# Patient Record
Sex: Female | Born: 1940 | Race: Black or African American | Hispanic: No | Smoking: Former smoker
Health system: Southern US, Community
[De-identification: ages and names within clinical notes are randomized; demographics above are authoritative.]

## PROBLEM LIST (undated history)

## (undated) DIAGNOSIS — F32A Depression, unspecified: Secondary | ICD-10-CM

## (undated) DIAGNOSIS — I10 Essential (primary) hypertension: Secondary | ICD-10-CM

## (undated) DIAGNOSIS — F329 Major depressive disorder, single episode, unspecified: Secondary | ICD-10-CM

## (undated) DIAGNOSIS — R4701 Aphasia: Secondary | ICD-10-CM

## (undated) DIAGNOSIS — R001 Bradycardia, unspecified: Secondary | ICD-10-CM

## (undated) DIAGNOSIS — I6200 Nontraumatic subdural hemorrhage, unspecified: Secondary | ICD-10-CM

## (undated) DIAGNOSIS — E119 Type 2 diabetes mellitus without complications: Secondary | ICD-10-CM

## (undated) DIAGNOSIS — F0390 Unspecified dementia without behavioral disturbance: Secondary | ICD-10-CM

---

## 1998-05-25 HISTORY — PX: PACEMAKER INSERTION: SHX728

## 2015-08-31 ENCOUNTER — Emergency Department (HOSPITAL_BASED_OUTPATIENT_CLINIC_OR_DEPARTMENT_OTHER)
Admission: EM | Admit: 2015-08-31 | Discharge: 2015-08-31 | Disposition: A | Discharge status: Home / Self Care | Payer: Medicare Other | Attending: Emergency Medicine | Admitting: Emergency Medicine

## 2015-08-31 ENCOUNTER — Emergency Department (HOSPITAL_BASED_OUTPATIENT_CLINIC_OR_DEPARTMENT_OTHER): Payer: Medicare Other

## 2015-08-31 ENCOUNTER — Encounter (HOSPITAL_BASED_OUTPATIENT_CLINIC_OR_DEPARTMENT_OTHER): Payer: Self-pay

## 2015-08-31 DIAGNOSIS — S0990XA Unspecified injury of head, initial encounter: Secondary | ICD-10-CM

## 2015-08-31 DIAGNOSIS — E119 Type 2 diabetes mellitus without complications: Secondary | ICD-10-CM | POA: Insufficient documentation

## 2015-08-31 DIAGNOSIS — Z79899 Other long term (current) drug therapy: Secondary | ICD-10-CM | POA: Insufficient documentation

## 2015-08-31 DIAGNOSIS — Y939 Activity, unspecified: Secondary | ICD-10-CM | POA: Insufficient documentation

## 2015-08-31 DIAGNOSIS — S80212A Abrasion, left knee, initial encounter: Secondary | ICD-10-CM | POA: Insufficient documentation

## 2015-08-31 DIAGNOSIS — W050XXA Fall from non-moving wheelchair, initial encounter: Secondary | ICD-10-CM | POA: Diagnosis not present

## 2015-08-31 DIAGNOSIS — I10 Essential (primary) hypertension: Secondary | ICD-10-CM | POA: Diagnosis not present

## 2015-08-31 DIAGNOSIS — Y929 Unspecified place or not applicable: Secondary | ICD-10-CM | POA: Insufficient documentation

## 2015-08-31 DIAGNOSIS — S0081XA Abrasion of other part of head, initial encounter: Secondary | ICD-10-CM | POA: Insufficient documentation

## 2015-08-31 DIAGNOSIS — Y999 Unspecified external cause status: Secondary | ICD-10-CM | POA: Insufficient documentation

## 2015-08-31 DIAGNOSIS — W19XXXA Unspecified fall, initial encounter: Secondary | ICD-10-CM

## 2015-08-31 HISTORY — DX: Bradycardia, unspecified: R00.1

## 2015-08-31 HISTORY — DX: Unspecified dementia, unspecified severity, without behavioral disturbance, psychotic disturbance, mood disturbance, and anxiety: F03.90

## 2015-08-31 HISTORY — DX: Essential (primary) hypertension: I10

## 2015-08-31 HISTORY — DX: Aphasia: R47.01

## 2015-08-31 HISTORY — DX: Type 2 diabetes mellitus without complications: E11.9

## 2015-08-31 LAB — CBG MONITORING, ED: GLUCOSE-CAPILLARY: 78 mg/dL (ref 65–99)

## 2015-08-31 NOTE — ED Notes (Signed)
[facility] called for transport back to [facility]

## 2015-08-31 NOTE — ED Notes (Signed)
Vitals during transport per EMS, 112/72, hr 72, CBG 145.  Pt alert, non-verbal at baseline.  Pt able to make sounds, but with incoherent mumbling per her normal baseline.  Pt suffered witnessed fall from wheelchair and mild superficial abrasion noted to left upper forehead.  Pt does not articulate any pain and appears relaxed and is smiling in bed, eyes open and attentive to people in the room.

## 2015-08-31 NOTE — Discharge Instructions (Signed)
Head Injury, Adult °You have a head injury. Headaches and throwing up (vomiting) are common after a head injury. It should be easy to wake up from sleeping. Sometimes you must stay in the hospital. Most problems happen within the first 24 hours. Side effects may occur up to 7-10 days after the injury.  °WHAT ARE THE TYPES OF HEAD INJURIES? °Head injuries can be as minor as a bump. Some head injuries can be more severe. More severe head injuries include: °· A jarring injury to the brain (concussion). °· A bruise of the brain (contusion). This mean there is bleeding in the brain that can cause swelling. °· A cracked skull (skull fracture). °· Bleeding in the brain that collects, clots, and forms a bump (hematoma). °WHEN SHOULD I GET HELP RIGHT AWAY?  °· You are confused or sleepy. °· You cannot be woken up. °· You feel sick to your stomach (nauseous) or keep throwing up (vomiting). °· Your dizziness or unsteadiness is getting worse. °· You have very bad, lasting headaches that are not helped by medicine. Take medicines only as told by your doctor. °· You cannot use your arms or legs like normal. °· You cannot walk. °· You notice changes in the black spots in the center of the colored part of your eye (pupil). °· You have clear or bloody fluid coming from your nose or ears. °· You have trouble seeing. °During the next 24 hours after the injury, you must stay with someone who can watch you. This person should get help right away (call 911 in the [place] if you start to shake and are not able to control it (have seizures), you pass out, or you are unable to wake up. °HOW CAN I PREVENT A HEAD INJURY IN THE FUTURE? °· Wear seat belts. °· Wear a helmet while bike riding and playing sports like football. °· Stay away from dangerous activities around the house. °WHEN CAN I RETURN TO NORMAL ACTIVITIES AND ATHLETICS? °See your doctor before doing these activities. You should not do normal activities or play contact sports until 1  week after the following symptoms have stopped: °· Headache that does not go away. °· Dizziness. °· Poor attention. °· Confusion. °· Memory problems. °· Sickness to your stomach or throwing up. °· Tiredness. °· Fussiness. °· Bothered by bright lights or loud noises. °· Anxiousness or depression. °· Restless sleep. °MAKE SURE YOU:  °· Understand these instructions. °· Will watch your condition. °· Will get help right away if you are not doing well or get worse. °  °This information is not intended to replace advice given to you by your health care provider. Make sure you discuss any questions you have with your health care provider. °  °Document Released: 04/23/2008 Document Revised: 06/01/2014 Document Reviewed: 01/16/2013 °Elsevier Interactive Patient Education ©2016 [employer] ° °

## 2015-08-31 NOTE — ED Notes (Signed)
Patient arrived from [facility] after falling forward from wheelchair this am onto carpeted floor. Redness noted to forehead. No other trauma noted. No complaints

## 2015-08-31 NOTE — ED Provider Notes (Signed)
CSN: [id number]     Arrival date & time 08/31/15  1117 History   First MD Initiated Contact with Patient 08/31/15 1123     Chief Complaint  Patient presents with  . Fall     (Consider location/radiation/quality/duration/timing/severity/associated sxs/prior Treatment) HPI Comments: Patient with a history of dementia, diabetes and hypertension presents after a fall. Per EMS, she was sitting in a wheelchair and fell forward out of the wheelchair onto a carpeted floor. There is no reported loss of consciousness. She's been at her baseline mental status per report. History is limited due to her dementia. She hasn't been complaining of any pain.  Patient is a 75 y.o. female presenting with fall.  Fall    Past Medical History  Diagnosis Date  . Dementia   . Aphasia   . Diabetes mellitus without complication (HCC)   . Hypertension   . Bradycardia    History reviewed. No pertinent past surgical history. No family history on file. Social History  Substance Use Topics  . Smoking status: Never Smoker   . Smokeless tobacco: None  . Alcohol Use: None   OB History    No data available     Review of Systems  Unable to perform ROS: Dementia      Allergies  Review of patient's allergies indicates no known allergies.  Home Medications   Prior to Admission medications   Medication Sig Start Date End Date Taking? Authorizing Provider  cholecalciferol (VITAMIN D) 1000 units tablet Take 1,000 Units by mouth daily.   Yes Historical Provider, MD  diltiazem (CARDIZEM CD) 180 MG 24 hr capsule Take 180 mg by mouth daily.   Yes Historical Provider, MD  escitalopram (LEXAPRO) 10 MG tablet Take 10 mg by mouth daily.   Yes Historical Provider, MD  fenofibrate micronized (LOFIBRA) 134 MG capsule Take 134 mg by mouth daily before breakfast.   Yes Historical Provider, MD  magnesium oxide (MAG-OX) 400 MG tablet Take 400 mg by mouth daily.   Yes Historical Provider, MD  senna (SENOKOT) 8.6 MG tablet  Take 1 tablet by mouth daily.   Yes Historical Provider, MD   BP 122/77 mmHg  Pulse 62  Temp(Src) 98.3 F (36.8 C) (Axillary)  Resp 18  SpO2 100% Physical Exam  Constitutional: She appears well-developed and well-nourished.  HENT:  Head: Normocephalic.  Small abrasion to the center of her forehead. There is no underlying bony tenderness. No swelling or tenderness to her facial bones.  Eyes: Pupils are equal, round, and reactive to light.  Neck: Normal range of motion. Neck supple.  No pain to the cervical thoracic or lumbosacral spine  Cardiovascular: Normal rate, regular rhythm and normal heart sounds.   Pulmonary/Chest: Effort normal and breath sounds normal. No respiratory distress. She has no wheezes. She has no rales. She exhibits no tenderness.  Abdominal: Soft. Bowel sounds are normal. There is no tenderness. There is no rebound and no guarding.  Musculoskeletal: Normal range of motion. She exhibits no edema.  There is a small abrasion on her left knee but no underlying tenderness on palpation. No other pain on palpation or range of motion of her extremities including the hips.  Lymphadenopathy:    She has no cervical adenopathy.  Neurological: She is alert.  Patient is alert but has nonsensical speech. She moves all actually symmetrically without focal deficits.  Skin: Skin is warm and dry. No rash noted.  Psychiatric: She has a normal mood and affect.    ED Course  Procedures (including critical care time) Labs Review Results for orders placed or performed during the hospital encounter of 08/31/15  CBG monitoring, ED  Result Value Ref Range   Glucose-Capillary 78 65 - 99 mg/dL   Ct Head Wo Contrast  08/31/2015  CLINICAL DATA:  alert, non-verbal at baseline. Pt able to make sounds, but with incoherent mumbling per her normal baseline. Pt suffered witnessed fall from wheelchair and mild superficial abrasion noted to left upper forehead. Pt does not articulate any pain and  appears relaxed and is smiling in bed, eyes open and attentive to people in the room. EXAM: CT HEAD WITHOUT CONTRAST CT CERVICAL SPINE WITHOUT CONTRAST TECHNIQUE: Multidetector CT imaging of the head and cervical spine was performed following the standard protocol without intravenous contrast. Multiplanar CT image reconstructions of the cervical spine were also generated. COMPARISON:  none FINDINGS: CT HEAD FINDINGS Brain: Moderate parenchymal atrophy. Patchy areas of hypoattenuation in deep and periventricular white matter bilaterally. Negative for acute intracranial hemorrhage, mass lesion, acute infarction, midline shift, or mass-effect. Acute infarct may be inapparent on noncontrast CT. Ventricles and sulci prominent, symmetric. Vascular: No hyperdense vessel or unexpected calcification. Atherosclerotic and physiologic intracranial calcifications. Skull: Negative for fracture or focal lesion. Sinuses/Orbits: Minimal layering debris in the sphenoid sinus. Patchy mucoperiosteal thickening in maxillary sinuses right worse than left. CT CERVICAL SPINE FINDINGS Mild reversal of the normal lordosis in the lower cervical spine. 2 mm anterolisthesis of C4 on C5 . Moderate narrowing of interspaces C5-6, C6-7, C7-T1 with anterior and posterior endplate spurring at these levels. Negative for fracture. Facets are seated bilaterally. 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 No prevertebral soft tissue swelling. Left calcified carotid bifurcation plaque. Visualized lung apices clear. IMPRESSION: 1.  Negative for bleed or other acute intracranial process. 2. Atrophy and nonspecific white matter changes. 3. Negative for cervical fracture or other acute bone abnormality. 4. 2 mm anterolisthesis C4-5 probably related to facet disease. Flexion/extension radiographs may be useful to exclude dynamic instability if this is a clinical concern. 5. Cervical spondylitic changes C3-T1 as above. Electronically Signed   By: Corlis Leak  M.D.   On: 08/31/2015 12:17   Ct Cervical Spine Wo Contrast  08/31/2015  CLINICAL DATA:  alert, non-verbal at baseline. Pt able to make sounds, but with incoherent mumbling per her normal baseline. Pt suffered witnessed fall from wheelchair and mild superficial abrasion noted to left upper forehead. Pt does not articulate any pain and appears relaxed and is smiling in bed, eyes open and attentive to people in the room. EXAM: CT HEAD WITHOUT CONTRAST CT CERVICAL SPINE WITHOUT CONTRAST TECHNIQUE: Multidetector CT imaging of the head and cervical spine was performed following the standard protocol without intravenous contrast. Multiplanar CT image reconstructions of the cervical spine were also generated. COMPARISON:  none FINDINGS: CT HEAD FINDINGS Brain: Moderate parenchymal atrophy. Patchy areas of hypoattenuation in deep and periventricular white matter bilaterally. Negative for acute intracranial hemorrhage, mass lesion, acute infarction, midline shift, or mass-effect. Acute infarct may be inapparent on noncontrast CT. Ventricles and sulci prominent, symmetric. Vascular: No hyperdense vessel or unexpected calcification. Atherosclerotic and physiologic intracranial calcifications. Skull: Negative for fracture or focal lesion. Sinuses/Orbits: Minimal layering debris in the sphenoid sinus. Patchy mucoperiosteal thickening in maxillary sinuses right worse than left. CT CERVICAL SPINE FINDINGS Mild reversal of the normal lordosis in the lower cervical spine. 2 mm anterolisthesis of C4 on C5 . Moderate narrowing of interspaces C5-6, C6-7, C7-T1 with anterior and posterior endplate spurring at  these levels. Negative for fracture. Facets are seated bilaterally. 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 No prevertebral soft tissue swelling. Left calcified carotid bifurcation plaque. Visualized lung apices clear. IMPRESSION: 1.  Negative for bleed or other acute intracranial process. 2. Atrophy and nonspecific white  matter changes. 3. Negative for cervical fracture or other acute bone abnormality. 4. 2 mm anterolisthesis C4-5 probably related to facet disease. Flexion/extension radiographs may be useful to exclude dynamic instability if this is a clinical concern. 5. Cervical spondylitic changes C3-T1 as above. Electronically Signed   By: Corlis Leak M.D.   On: 08/31/2015 12:17      Imaging Review Ct Head Wo Contrast  08/31/2015  CLINICAL DATA:  alert, non-verbal at baseline. Pt able to make sounds, but with incoherent mumbling per her normal baseline. Pt suffered witnessed fall from wheelchair and mild superficial abrasion noted to left upper forehead. Pt does not articulate any pain and appears relaxed and is smiling in bed, eyes open and attentive to people in the room. EXAM: CT HEAD WITHOUT CONTRAST CT CERVICAL SPINE WITHOUT CONTRAST TECHNIQUE: Multidetector CT imaging of the head and cervical spine was performed following the standard protocol without intravenous contrast. Multiplanar CT image reconstructions of the cervical spine were also generated. COMPARISON:  none FINDINGS: CT HEAD FINDINGS Brain: Moderate parenchymal atrophy. Patchy areas of hypoattenuation in deep and periventricular white matter bilaterally. Negative for acute intracranial hemorrhage, mass lesion, acute infarction, midline shift, or mass-effect. Acute infarct may be inapparent on noncontrast CT. Ventricles and sulci prominent, symmetric. Vascular: No hyperdense vessel or unexpected calcification. Atherosclerotic and physiologic intracranial calcifications. Skull: Negative for fracture or focal lesion. Sinuses/Orbits: Minimal layering debris in the sphenoid sinus. Patchy mucoperiosteal thickening in maxillary sinuses right worse than left. CT CERVICAL SPINE FINDINGS Mild reversal of the normal lordosis in the lower cervical spine. 2 mm anterolisthesis of C4 on C5 . Moderate narrowing of interspaces C5-6, C6-7, C7-T1 with anterior and posterior  endplate spurring at these levels. Negative for fracture. Facets are seated bilaterally. 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 No prevertebral soft tissue swelling. Left calcified carotid bifurcation plaque. Visualized lung apices clear. IMPRESSION: 1.  Negative for bleed or other acute intracranial process. 2. Atrophy and nonspecific white matter changes. 3. Negative for cervical fracture or other acute bone abnormality. 4. 2 mm anterolisthesis C4-5 probably related to facet disease. Flexion/extension radiographs may be useful to exclude dynamic instability if this is a clinical concern. 5. Cervical spondylitic changes C3-T1 as above. Electronically Signed   By: Corlis Leak M.D.   On: 08/31/2015 12:17   Ct Cervical Spine Wo Contrast  08/31/2015  CLINICAL DATA:  alert, non-verbal at baseline. Pt able to make sounds, but with incoherent mumbling per her normal baseline. Pt suffered witnessed fall from wheelchair and mild superficial abrasion noted to left upper forehead. Pt does not articulate any pain and appears relaxed and is smiling in bed, eyes open and attentive to people in the room. EXAM: CT HEAD WITHOUT CONTRAST CT CERVICAL SPINE WITHOUT CONTRAST TECHNIQUE: Multidetector CT imaging of the head and cervical spine was performed following the standard protocol without intravenous contrast. Multiplanar CT image reconstructions of the cervical spine were also generated. COMPARISON:  none FINDINGS: CT HEAD FINDINGS Brain: Moderate parenchymal atrophy. Patchy areas of hypoattenuation in deep and periventricular white matter bilaterally. Negative for acute intracranial hemorrhage, mass lesion, acute infarction, midline shift, or mass-effect. Acute infarct may be inapparent on noncontrast CT. Ventricles and sulci prominent, symmetric. Vascular: No  hyperdense vessel or unexpected calcification. Atherosclerotic and physiologic intracranial calcifications. Skull: Negative for fracture or focal lesion.  Sinuses/Orbits: Minimal layering debris in the sphenoid sinus. Patchy mucoperiosteal thickening in maxillary sinuses right worse than left. CT CERVICAL SPINE FINDINGS Mild reversal of the normal lordosis in the lower cervical spine. 2 mm anterolisthesis of C4 on C5 . Moderate narrowing of interspaces C5-6, C6-7, C7-T1 with anterior and posterior endplate spurring at these levels. Negative for fracture. Facets are seated bilaterally. 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 No prevertebral soft tissue swelling. Left calcified carotid bifurcation plaque. Visualized lung apices clear. IMPRESSION: 1.  Negative for bleed or other acute intracranial process. 2. Atrophy and nonspecific white matter changes. 3. Negative for cervical fracture or other acute bone abnormality. 4. 2 mm anterolisthesis C4-5 probably related to facet disease. Flexion/extension radiographs may be useful to exclude dynamic instability if this is a clinical concern. 5. Cervical spondylitic changes C3-T1 as above. Electronically Signed   By: Corlis Leak M.D.   On: 08/31/2015 12:17   I have personally reviewed and evaluated these images and lab results as part of my medical decision-making.   EKG Interpretation None      MDM   Final diagnoses:  Fall, initial encounter  Minor head injury, initial encounter    Patient presents after a fall. It sounds like a mechanical fall. She is alert and active. Her vital signs are unremarkable. She did have a CT scan of her head and neck given the head injury. There is no evidence of intracranial hemorrhage. There is no evidence of acute cervical spine injury. There is a small amount of anterolisthesis of C4 on C5. Patient doesn't seem to have any discomfort with movement of her neck. She was discharged back to the nursing facility. She's not on anticoagulants. She doesn't have any extremity discomfort on exam.    Rolan Bucco, MD 08/31/15 1232

## 2015-10-24 ENCOUNTER — Encounter (HOSPITAL_COMMUNITY): Payer: Self-pay | Admitting: *Deleted

## 2015-10-24 ENCOUNTER — Emergency Department (HOSPITAL_COMMUNITY): Payer: Medicare Other

## 2015-10-24 ENCOUNTER — Emergency Department (HOSPITAL_COMMUNITY)
Admission: EM | Admit: 2015-10-24 | Discharge: 2015-10-24 | Disposition: A | Discharge status: Skilled Nursing Facility | Payer: Medicare Other | Attending: Emergency Medicine | Admitting: Emergency Medicine

## 2015-10-24 DIAGNOSIS — F0280 Dementia in other diseases classified elsewhere without behavioral disturbance: Secondary | ICD-10-CM | POA: Insufficient documentation

## 2015-10-24 DIAGNOSIS — E119 Type 2 diabetes mellitus without complications: Secondary | ICD-10-CM | POA: Insufficient documentation

## 2015-10-24 DIAGNOSIS — Y929 Unspecified place or not applicable: Secondary | ICD-10-CM | POA: Insufficient documentation

## 2015-10-24 DIAGNOSIS — S0083XA Contusion of other part of head, initial encounter: Secondary | ICD-10-CM | POA: Diagnosis present

## 2015-10-24 DIAGNOSIS — I10 Essential (primary) hypertension: Secondary | ICD-10-CM | POA: Diagnosis not present

## 2015-10-24 DIAGNOSIS — G308 Other Alzheimer's disease: Secondary | ICD-10-CM | POA: Diagnosis not present

## 2015-10-24 DIAGNOSIS — W1830XA Fall on same level, unspecified, initial encounter: Secondary | ICD-10-CM | POA: Diagnosis not present

## 2015-10-24 DIAGNOSIS — Y939 Activity, unspecified: Secondary | ICD-10-CM | POA: Insufficient documentation

## 2015-10-24 DIAGNOSIS — Y999 Unspecified external cause status: Secondary | ICD-10-CM | POA: Diagnosis not present

## 2015-10-24 NOTE — ED Notes (Signed)
Pt's sats dropped to 65% while asleep, woke pt up, sats increased to 99%.

## 2015-10-24 NOTE — ED Provider Notes (Signed)
CSN: [id number]     Arrival date & time 10/24/15  4098 History   First MD Initiated Contact with Patient 10/24/15 0815     Chief Complaint  Patient presents with  . Fall     (Consider location/radiation/quality/duration/timing/severity/associated sxs/prior Treatment) HPI Comments: Patient with h/o alzheimer's dementia -- presents after unwitnessed fall. Hematoma noted on forehead. Presents in [place] with c-collar. No anticoagulation. Level V caveat 2/2 dementia.   Patient is a 75 y.o. female presenting with fall. The history is provided by the EMS personnel and medical records.  Fall    Past Medical History  Diagnosis Date  . Dementia   . Aphasia   . Diabetes mellitus without complication (HCC)   . Hypertension   . Bradycardia    No past surgical history on file. No family history on file. Social History  Substance Use Topics  . Smoking status: Never Smoker   . Smokeless tobacco: None  . Alcohol Use: None   OB History    No data available     Review of Systems  Unable to perform ROS: Dementia      Allergies  Review of patient's allergies indicates no known allergies.  Home Medications   Prior to Admission medications   Medication Sig Start Date End Date Taking? Authorizing Provider  cholecalciferol (VITAMIN D) 1000 units tablet Take 1,000 Units by mouth daily.    Historical Provider, MD  diltiazem (CARDIZEM CD) 180 MG 24 hr capsule Take 180 mg by mouth daily.    Historical Provider, MD  escitalopram (LEXAPRO) 10 MG tablet Take 10 mg by mouth daily.    Historical Provider, MD  fenofibrate micronized (LOFIBRA) 134 MG capsule Take 134 mg by mouth daily before breakfast.    Historical Provider, MD  magnesium oxide (MAG-OX) 400 MG tablet Take 400 mg by mouth daily.    Historical Provider, MD  senna (SENOKOT) 8.6 MG tablet Take 1 tablet by mouth daily.    Historical Provider, MD   BP 143/75 mmHg  Pulse 67  Temp(Src) 97.6 F (36.4 C) (Oral)  Resp 16  SpO2 98%    Physical Exam  Constitutional: She appears well-developed and well-nourished.  HENT:  Head: Normocephalic. Head is without raccoon's eyes and without Battle's sign.  Right Ear: Tympanic membrane, external ear and ear canal normal. No hemotympanum.  Left Ear: Tympanic membrane, external ear and ear canal normal. No hemotympanum.  Nose: Nose normal. No nasal septal hematoma.  Mouth/Throat: Uvula is midline, oropharynx is clear and moist and mucous membranes are normal.  Frontal forehead hematoma. Poor dentition.   Eyes: Conjunctivae, EOM and lids are normal. Pupils are equal, round, and reactive to light. Right eye exhibits no nystagmus. Left eye exhibits no nystagmus.  No visible hyphema noted  Neck: Normal range of motion. Neck supple.  Cardiovascular: Normal rate and regular rhythm.   No murmur heard. Pulmonary/Chest: Effort normal and breath sounds normal. No respiratory distress. She has no wheezes. She has no rales.  Abdominal: Soft. There is no tenderness.  Musculoskeletal:       Right hip: Normal.       Left hip: Normal.       Cervical back: She exhibits normal range of motion, no tenderness and no bony tenderness.       Thoracic back: She exhibits no tenderness and no bony tenderness.       Lumbar back: She exhibits no tenderness and no bony tenderness.  Patient does not have any apparent hip pain.  Can passively move extremities without apparent pain.   Neurological: She has normal strength. No cranial nerve deficit or sensory deficit. Coordination normal. GCS eye subscore is 4. GCS verbal subscore is 5. GCS motor subscore is 6.  Patient makes eye contact, mumbles unintelligably.   Skin: Skin is warm and dry.  Psychiatric: She has a normal mood and affect.  Nursing note and vitals reviewed.   ED Course  Procedures (including critical care time)  Imaging Review Ct Head Wo Contrast  10/24/2015  CLINICAL DATA:  Pain following fall.  History of dementia. EXAM: CT HEAD WITHOUT  CONTRAST CT CERVICAL SPINE WITHOUT CONTRAST TECHNIQUE: Multidetector CT imaging of the head and cervical spine was performed following the standard protocol without intravenous contrast. Multiplanar CT image reconstructions of the cervical spine were also generated. COMPARISON:  CT head and CT cervical spine August 31, 2015 FINDINGS: CT HEAD FINDINGS Moderate diffuse atrophy remain stable. There is no intracranial mass, hemorrhage, extra-axial fluid collection, or midline shift. Moderate small vessel disease in the centra semiovale is stable. There is no new gray-white compartment lesion. No acute infarct. There is a moderate midline frontal scalp hematoma. The bony calvarium appears intact. Mastoid air cells on the right are clear. There is chronic thickening of multiple mastoid air cells inferiorly on the left. Most of the mastoids on the left are clear and unchanged from prior study. No intraorbital lesions are evident. There is opacification in a portion of the left sphenoid sinus. There is also mucosal thickening in both maxillary antra. There is a small air-fluid level in the left frontal sinus laterally. CT CERVICAL SPINE FINDINGS There is no demonstrable fracture. There is 3 mm of anterolisthesis of C4 on C5, not appreciably changed from prior study. There is no new spondylolisthesis. Prevertebral soft tissues and predental space regions are within normal limits and stable. There is severe disc space narrowing at C5-6, C6-7, C7-T1, and T1-2. There is moderate disc space narrowing at C3-4 and C4-5. There is facet hypertrophy at multiple levels. There is exit foraminal narrowing at multiple levels, most notably at C3-4 on the right, C4-5 on the left, and C5-6 bilaterally. There are scattered foci of carotid artery calcification on the left. Thyroid shows mild inhomogeneity. IMPRESSION: CT head: Stable atrophy with periventricular small vessel disease. No intracranial mass, hemorrhage, or acute appearing infarct.  There is no extra-axial fluid collection. There is a midline frontal scalp hematoma without fracture evident. There is chronic inferior left mastoid disease, stable. There are areas of paranasal sinus disease. Note that there is a small air-fluid level in the lateral left frontal sinus. CT cervical spine: No fracture. Stable spondylolisthesis at C4-5, likely due to underlying spondylosis. No new spondylolisthesis. There is multilevel arthropathy, essentially stable. Left carotid artery calcification noted. Electronically Signed   By: Bretta BangWilliam  Woodruff III M.D.   On: 10/24/2015 09:33   Ct Cervical Spine Wo Contrast  10/24/2015  CLINICAL DATA:  Pain following fall.  History of dementia. EXAM: CT HEAD WITHOUT CONTRAST CT CERVICAL SPINE WITHOUT CONTRAST TECHNIQUE: Multidetector CT imaging of the head and cervical spine was performed following the standard protocol without intravenous contrast. Multiplanar CT image reconstructions of the cervical spine were also generated. COMPARISON:  CT head and CT cervical spine August 31, 2015 FINDINGS: CT HEAD FINDINGS Moderate diffuse atrophy remain stable. There is no intracranial mass, hemorrhage, extra-axial fluid collection, or midline shift. Moderate small vessel disease in the centra semiovale is stable. There is no new gray-white compartment  lesion. No acute infarct. There is a moderate midline frontal scalp hematoma. The bony calvarium appears intact. Mastoid air cells on the right are clear. There is chronic thickening of multiple mastoid air cells inferiorly on the left. Most of the mastoids on the left are clear and unchanged from prior study. No intraorbital lesions are evident. There is opacification in a portion of the left sphenoid sinus. There is also mucosal thickening in both maxillary antra. There is a small air-fluid level in the left frontal sinus laterally. CT CERVICAL SPINE FINDINGS There is no demonstrable fracture. There is 3 mm of anterolisthesis of C4 on  C5, not appreciably changed from prior study. There is no new spondylolisthesis. Prevertebral soft tissues and predental space regions are within normal limits and stable. There is severe disc space narrowing at C5-6, C6-7, C7-T1, and T1-2. There is moderate disc space narrowing at C3-4 and C4-5. There is facet hypertrophy at multiple levels. There is exit foraminal narrowing at multiple levels, most notably at C3-4 on the right, C4-5 on the left, and C5-6 bilaterally. There are scattered foci of carotid artery calcification on the left. Thyroid shows mild inhomogeneity. IMPRESSION: CT head: Stable atrophy with periventricular small vessel disease. No intracranial mass, hemorrhage, or acute appearing infarct. There is no extra-axial fluid collection. There is a midline frontal scalp hematoma without fracture evident. There is chronic inferior left mastoid disease, stable. There are areas of paranasal sinus disease. Note that there is a small air-fluid level in the lateral left frontal sinus. CT cervical spine: No fracture. Stable spondylolisthesis at C4-5, likely due to underlying spondylosis. No new spondylolisthesis. There is multilevel arthropathy, essentially stable. Left carotid artery calcification noted. Electronically Signed   By: Bretta Bang III M.D.   On: 10/24/2015 09:33   I have personally reviewed and evaluated these images and lab results as part of my medical decision-making.   8:24 AM Patient seen and examined. Dementia and non-verbal. Only apparent injury seems to be to head. Imaging ordered.   Vital signs reviewed and are as follows: BP 143/75 mmHg  Pulse 67  Temp(Src) 97.6 F (36.4 C) (Oral)  Resp 16  SpO2 98%  10:02 AM Imaging is neg. Will d/c. Patient re-evaluated. Still no apparent pain with ranging of extremities. Chest wall and abdomen are non-tender. Will d/c.   MDM   Final diagnoses:  Traumatic hematoma of forehead, initial encounter   Imaging neg. Patient back  to nursing facility.    Renne Crigler, PA-C 10/24/15 1004  Melene Plan, DO 10/24/15 1009

## 2015-10-24 NOTE — ED Notes (Signed)
Per EMS[facility] pt sitting in her WC, staff found pt on floor beside her WC.  Hematoma noted on forehead.  Not on blood thinners.  Pt has hx of alzheimer's Disease.

## 2015-10-24 NOTE — ED Notes (Signed)
See triage note.  Large hematoma noted on pt's forehead.  Pt is non-verbal.  Pt is guarding her L arm, however, does not react when L arm is manipulated by Adela LankFloyd EDP.  Pt speaks spanish.

## 2015-10-24 NOTE — ED Notes (Signed)
[facility]

## 2015-10-24 NOTE — Discharge Instructions (Signed)
Please read and follow all provided instructions.  Your diagnoses today include:  1. Traumatic hematoma of forehead, initial encounter     Tests performed today include:  CT scan of your head and neck that did not show any serious injury.  Vital signs. See below for your results today.   Medications prescribed:   None  Take any prescribed medications only as directed.  Home care instructions:  Follow any educational materials contained in this packet.  BE VERY CAREFUL not to take multiple medicines containing Tylenol (also called acetaminophen). Doing so can lead to an overdose which can damage your liver and cause liver failure and possibly death.   Follow-up instructions: Please follow-up with your primary care provider in the next 3 days for further evaluation of your symptoms.   Return instructions:  SEEK IMMEDIATE MEDICAL ATTENTION IF:  There is confusion or drowsiness (although children frequently become drowsy after injury).   You cannot awaken the injured person.   You have more than one episode of vomiting.   You notice dizziness or unsteadiness which is getting worse, or inability to walk.   You have convulsions or unconsciousness.   You experience severe, persistent headaches not relieved by Tylenol.  You cannot use arms or legs normally.   There are changes in pupil sizes. (This is the black center in the colored part of the eye)   There is clear or bloody discharge from the nose or ears.   You have change in speech, vision, swallowing, or understanding.   Localized weakness, numbness, tingling, or change in bowel or bladder control.  You have any other emergent concerns.  Additional Information: You have had a head injury which does not appear to require admission at this time.  Your vital signs today were: BP 143/75 mmHg   Pulse 67   Temp(Src) 97.6 F (36.4 C) (Oral)   Resp 16   SpO2 98% If your blood pressure (BP) was elevated above 135/85  this visit, please have this repeated by your doctor within one month. --------------

## 2015-10-24 NOTE — ED Notes (Signed)
[facility] contacted for transport back to [facility] °

## 2015-10-24 NOTE — ED Notes (Signed)
Bed: [id number] Expected date:  Expected time:  Means of arrival:  Comments: EMS- 90s, unwitnessed fall/no injury

## 2015-11-18 ENCOUNTER — Telehealth: Payer: Self-pay | Admitting: Cardiovascular Disease

## 2015-11-18 NOTE — Telephone Encounter (Signed)
11/18/2015 [facility] for upcoming appointment with Dr. Royann Shiversroitoru on 01/03/2016.  [facility]  cbr

## 2016-01-03 ENCOUNTER — Ambulatory Visit (INDEPENDENT_AMBULATORY_CARE_PROVIDER_SITE_OTHER): Payer: Medicare Other | Admitting: Cardiovascular Disease

## 2016-01-03 ENCOUNTER — Encounter: Payer: Self-pay | Admitting: Cardiovascular Disease

## 2016-01-03 ENCOUNTER — Encounter (INDEPENDENT_AMBULATORY_CARE_PROVIDER_SITE_OTHER): Payer: Self-pay

## 2016-01-03 VITALS — BP 114/80 | HR 71

## 2016-01-03 DIAGNOSIS — E785 Hyperlipidemia, unspecified: Secondary | ICD-10-CM

## 2016-01-03 DIAGNOSIS — Z950 Presence of cardiac pacemaker: Secondary | ICD-10-CM | POA: Diagnosis not present

## 2016-01-03 DIAGNOSIS — I471 Supraventricular tachycardia: Secondary | ICD-10-CM | POA: Diagnosis not present

## 2016-01-03 DIAGNOSIS — R011 Cardiac murmur, unspecified: Secondary | ICD-10-CM

## 2016-01-03 DIAGNOSIS — I495 Sick sinus syndrome: Secondary | ICD-10-CM

## 2016-01-03 NOTE — Progress Notes (Signed)
Cardiology Office Note    Date:  01/03/2016   ID:  Paige Flores, DOB 1940-10-15, MRN [id number]  PCP:  Paige Parker, MD  Cardiologist:   Paige Fair, MD    chief complaint: Pacemaker evaluation   History of Present Illness:  Paige Flores is a 75 y.o. female, originally [place] with a dual-chamber permanent pacemaker was reportedly implanted in [place] in 2011, as a generator change out with initial implantation of her device in 2001. She was briefly followed by Dr. Hanley Flores, but it seems her pacemaker has not been interrogated since 2012.   The patient has significant dementia and lives at [facility]. Her daughter Paige Flores accompanies her to the appointment today. The patient has a DO NOT RESUSCITATE status.  She is pleasantly demented and smiles. She speaks Spanish only but even in Spanish does not make any sense. She has a heart murmur but we do not know any details about structural heart disease. She takes fenofibrate presumably for hypertriglyceridemia but is not on a statin. Current medications to also include diltiazem, suggesting she has possibly had problems with atrial arrhythmia. Very little detail is available about previous medical problems.  Interrogation of her pacemaker shows normal device function. She has a Environmental manager true device with an estimated 5 years of remaining battery longevity. The presenting rhythm is atrial sensed (sinus rhythm) ventricular sensed alternating with atrial paced ventricular sensed. Counters show she virtually never has ventricular pacing. She has roughly 14% atrial pacing. In the 5 years that have elapsed since her last device check she has had 29 episodes of mode switch, all of which appear to be brief nonsustained atrial tachycardia. Heart rate histogram distribution is normal. She is clearly not pacemaker dependent. All the lead parameters are normal:  Atrial lead P waves 1.8 mV, impedance 530 ohms, threshold 0.6 V at 0.4 ms  (checked manually today). Ventricular lead R waves 8.7 mV, impedance 840 ohms, threshold 0.8 V at 0.4 ms (auto capture yesterday, confirmed manually today).    Past Medical History:  Diagnosis Date  . Aphasia   . Bradycardia   . Dementia   . Diabetes mellitus without complication (HCC)   . Hypertension     No past surgical history on file.  Current Medications: Outpatient Medications Prior to Visit  Medication Sig Dispense Refill  . acetaminophen (TYLENOL) 500 MG tablet Take 500 mg by mouth every 4 (four) hours as needed for mild pain or headache.    Marland Kitchen alum & mag hydroxide-simeth (MINTOX) 200-200-20 MG/5ML suspension Take 30 mLs by mouth as needed for indigestion or heartburn. Max 4 doses per 24 hours    . cholecalciferol (VITAMIN D) 1000 units tablet Take 1,000 Units by mouth daily.    Marland Kitchen diltiazem (CARDIZEM CD) 180 MG 24 hr capsule Take 180 mg by mouth daily.    Marland Kitchen escitalopram (LEXAPRO) 10 MG tablet Take 10 mg by mouth daily.    . fenofibrate micronized (LOFIBRA) 134 MG capsule Take 134 mg by mouth daily before breakfast.    . guaifenesin (ROBITUSSIN) 100 MG/5ML syrup Take 200 mg by mouth every 6 (six) hours as needed for cough.    . hydrOXYzine (ATARAX/VISTARIL) 10 MG tablet Take 10 mg by mouth every 8 (eight) hours as needed for itching.    Marland Kitchen L-Methylfolate-B12-B6-B2 (CEREFOLIN PO) Take 1 tablet by mouth daily.    Marland Kitchen loperamide (IMODIUM) 2 MG capsule Take 2 mg by mouth as needed for diarrhea or loose stools. Max 8 doses  in 24 hrs    . magnesium hydroxide (MILK OF MAGNESIA) 400 MG/5ML suspension Take 30 mLs by mouth at bedtime as needed for mild constipation.    . magnesium oxide (MAG-OX) 400 MG tablet Take 400 mg by mouth daily.    Marland Kitchen neomycin-bacitracin-polymyxin (NEOSPORIN) ointment Apply 1 application topically as needed for wound care. apply to eye    . senna (SENOKOT) 8.6 MG tablet Take 1 tablet by mouth daily.     No facility-administered medications prior to visit.       Allergies:   Review of patient's allergies indicates no known allergies.   Social History   Social History  . Marital status: Widowed    Spouse name: N/A  . Number of children: N/A  . Years of education: N/A   Social History Main Topics  . Smoking status: Never Smoker  . Smokeless tobacco: Never Used  . Alcohol use No  . Drug use: No  . Sexual activity: Not Asked   Other Topics Concern  . None   Social History Narrative  . None     Family History:  The patient's family history includes Dementia in her mother; Healthy in her father.   ROS:   Please see the history of present illness.    ROS All other systems reviewed and are negative.   PHYSICAL EXAM:   VS:  BP 114/80   Pulse 71   unable to stand for weight or height, in wheelchair GEN: Well nourished, well developed, in no acute distress  HEENT: normal  Neck: no JVD, Faint carotid bruits bilaterally radiating from the chest, no goiter or masses Cardiac: RRR;2/6 early peaking aortic ejection murmur no Diastolic murmurs, rubs, or gallops,no edema , left subclavian pacemaker site looks healthy Respiratory:  clear to auscultation bilaterally, normal work of breathing GI: soft, nontender, nondistended, + BS MS: no deformity or atrophy  Skin: warm and dry, no rash Neuro:  Alert and Oriented x 3, Strength and sensation are intact Psych: euthymic mood, full affect  Wt Readings from Last 3 Encounters:  No data found for Wt      Studies/Labs Reviewed:   EKG:  EKG is ordered today.  The ekg ordered today demonstrates Sinus rhythm alternating with atrial paced-ventricular sensed rhythm, left anterior fascicular block, mild ST segment depression into inversion in leads V3-V6 and QS pattern in leads V1-V2  Recent Labs: No results found for requested labs within last 8760 hours.   Lipid Panel No results found for: CHOL, TRIG, HDL, CHOLHDL, VLDL, LDLCALC, LDLDIRECT  Additional studies/ records that were reviewed today  include:  Records from recent ER visits and evaluation for head injury    ASSESSMENT:    1. Sinus node dysfunction (HCC)   2. Pacemaker   3. Paroxysmal atrial tachycardia (HCC)   4. Murmur   5. Hyperlipidemia      PLAN:  In order of problems listed above:  1. SSS: Presumably her pacemaker was implanted for sinus node dysfunction, but she is clearly not pacemaker dependent and actually requires pacing only very infrequently. Rate histogram distribution appears appropriate. No changes are made to pacemaker settings today 2. PPM: Normal device function. It is difficult for her to come to the office for device checks. Clearly the patient wishes for the focus to be on her mother's quality of life and she understands the limitations imposed by her dementia. We have agreed that at least for the time being it is appropriate to perform pacemaker checks only  once a year. She has not had any problems in last 5 years on her pacemaker was not checked at all. 3. PAT: The episodes of atrial tachycardia are extremely infrequent. Only one episode has happened in the last year. Specific therapy is not necessary. The episodes are brief and as far as I can tell are not symptomatic 4. 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 She is not in any way candidate for surgery and further evaluation is deferred 5. HLP: It is doubtful that treatment with fenofibrate will make any difference in quality of life for length of life, whether or not she has hypertriglyceridemia. I think this medication can be discontinued. She does not have a history of pancreatitis.    Medication Adjustments/Labs and Tests Ordered: Current medicines are reviewed at length with the patient today.  Concerns regarding medicines are outlined above.  Medication changes, Labs and Tests ordered today are listed in the Patient Instructions below. Patient Instructions  Dr Royann Shiversroitoru recommends that you  schedule a follow-up appointment in 1 year. You will receive a reminder letter in the mail two months in advance. If you don't receive a letter, please call our office to schedule the follow-up appointment.    Signed, Paige FairMihai Waneda Klammer, MD  01/03/2016 5:28 PM    [facility] [address], [place], [place]  [address] Phone: [phone]; Fax: [phone]

## 2016-01-03 NOTE — Patient Instructions (Signed)
Dr Croitoru recommends that you schedule a follow-up appointment in 1 year. You will receive a reminder letter in the mail two months in advance. If you don't receive a letter, please call our office to schedule the follow-up appointment. 

## 2016-01-21 LAB — CUP PACEART INCLINIC DEVICE CHECK
Battery Remaining Longevity: 60 mo
Brady Statistic AP VP Percent: 0 %
Brady Statistic AP VS Percent: 14 %
Date Time Interrogation Session: 20170829084513
Lead Channel Impedance Value: 840 Ohm
Lead Channel Pacing Threshold Amplitude: 0.6 V
Lead Channel Pacing Threshold Amplitude: 0.8 V
Lead Channel Pacing Threshold Pulse Width: 0.4 ms
Lead Channel Pacing Threshold Pulse Width: 0.4 ms
Lead Channel Sensing Intrinsic Amplitude: 1.8 mV
Lead Channel Sensing Intrinsic Amplitude: 8.7 mV
Lead Channel Setting Pacing Amplitude: 2 V
Lead Channel Setting Pacing Amplitude: 2.5 V
Lead Channel Setting Pacing Pulse Width: 0.4 ms
MDC IDC MSMT LEADCHNL RA IMPEDANCE VALUE: 530 Ohm
MDC IDC STAT BRADY AS VP PERCENT: 0 %
MDC IDC STAT BRADY AS VS PERCENT: 86 %
Pulse Gen Serial Number: 152758

## 2016-01-22 ENCOUNTER — Emergency Department (HOSPITAL_COMMUNITY): Payer: Medicare Other

## 2016-01-22 ENCOUNTER — Emergency Department (HOSPITAL_COMMUNITY)
Admission: EM | Admit: 2016-01-22 | Discharge: 2016-01-22 | Disposition: A | Discharge status: Home / Self Care | Payer: Medicare Other | Attending: Emergency Medicine | Admitting: Emergency Medicine

## 2016-01-22 ENCOUNTER — Encounter (HOSPITAL_COMMUNITY): Payer: Self-pay | Admitting: Emergency Medicine

## 2016-01-22 DIAGNOSIS — W050XXA Fall from non-moving wheelchair, initial encounter: Secondary | ICD-10-CM | POA: Insufficient documentation

## 2016-01-22 DIAGNOSIS — Y939 Activity, unspecified: Secondary | ICD-10-CM | POA: Insufficient documentation

## 2016-01-22 DIAGNOSIS — E119 Type 2 diabetes mellitus without complications: Secondary | ICD-10-CM | POA: Insufficient documentation

## 2016-01-22 DIAGNOSIS — S0003XA Contusion of scalp, initial encounter: Secondary | ICD-10-CM | POA: Insufficient documentation

## 2016-01-22 DIAGNOSIS — Z79899 Other long term (current) drug therapy: Secondary | ICD-10-CM | POA: Insufficient documentation

## 2016-01-22 DIAGNOSIS — S0990XA Unspecified injury of head, initial encounter: Secondary | ICD-10-CM | POA: Diagnosis present

## 2016-01-22 DIAGNOSIS — F0390 Unspecified dementia without behavioral disturbance: Secondary | ICD-10-CM | POA: Diagnosis not present

## 2016-01-22 DIAGNOSIS — Y999 Unspecified external cause status: Secondary | ICD-10-CM | POA: Insufficient documentation

## 2016-01-22 DIAGNOSIS — W19XXXA Unspecified fall, initial encounter: Secondary | ICD-10-CM

## 2016-01-22 DIAGNOSIS — I10 Essential (primary) hypertension: Secondary | ICD-10-CM | POA: Insufficient documentation

## 2016-01-22 DIAGNOSIS — Z950 Presence of cardiac pacemaker: Secondary | ICD-10-CM | POA: Diagnosis not present

## 2016-01-22 DIAGNOSIS — Y9289 Other specified places as the place of occurrence of the external cause: Secondary | ICD-10-CM | POA: Diagnosis not present

## 2016-01-22 NOTE — ED Notes (Signed)
[facility].  Primary RN attempted to call report w/o answer.  PTAR verbalized understanding discharge instructions. In no acute distress.

## 2016-01-22 NOTE — ED Notes (Signed)
MD at bedside. 

## 2016-01-22 NOTE — ED Notes (Signed)
Patient transported to CT 

## 2016-01-22 NOTE — ED Triage Notes (Signed)
Patient from [facility] for fall from wheelchair. paitent didn't have any LOC per staff at SNF and is per patient's norm baseline which is non-verbal. Patient not on blood thinners.  Patient only has hematoma to head that EMS noted.

## 2016-01-22 NOTE — ED Provider Notes (Signed)
[facility]-EMERGENCY DEPT Provider Note   CSN: [id number] date & time: 01/22/16  1013     History   Chief Complaint Chief Complaint  Patient presents with  . Fall  . hematoma on head    HPI Paige Flores is a 75 y.o. female.  The history is provided by the nursing home. History limited by: Dementia.  Patient is brought to the emergency department after a fall from a wheelchair today.  No reported loss consciousness per staff.  Patient is not on blood thinners at his baseline mental status.  Patient presents with a hematoma to the forehead.  The patient has dementia and therefore cannot provide any additional information.  Past Medical History:  Diagnosis Date  . Aphasia   . Bradycardia   . Dementia   . Diabetes mellitus without complication (HCC)   . Hypertension     Patient Active Problem List   Diagnosis Date Noted  . Paroxysmal atrial tachycardia (HCC) 01/03/2016  . SSS (sick sinus syndrome) (HCC) 01/03/2016  . Pacemaker 01/03/2016  . Murmur 01/03/2016  . Hyperlipidemia 01/03/2016    History reviewed. No pertinent surgical history.  OB History    No data available       Home Medications    Prior to Admission medications   Medication Sig Start Date End Date Taking? Authorizing Provider  acetaminophen (TYLENOL) 500 MG tablet Take 500 mg by mouth every 4 (four) hours as needed for mild pain or headache.    Historical Provider, MD  alum & mag hydroxide-simeth (MINTOX) 200-200-20 MG/5ML suspension Take 30 mLs by mouth as needed for indigestion or heartburn. Max 4 doses per 24 hours    Historical Provider, MD  cholecalciferol (VITAMIN D) 1000 units tablet Take 1,000 Units by mouth daily.    Historical Provider, MD  diltiazem (CARDIZEM CD) 180 MG 24 hr capsule Take 180 mg by mouth daily.    Historical Provider, MD  escitalopram (LEXAPRO) 10 MG tablet Take 10 mg by mouth daily.    Historical Provider, MD  fenofibrate micronized (LOFIBRA) 134 MG capsule Take  134 mg by mouth daily before breakfast.    Historical Provider, MD  guaifenesin (ROBITUSSIN) 100 MG/5ML syrup Take 200 mg by mouth every 6 (six) hours as needed for cough.    Historical Provider, MD  hydrOXYzine (ATARAX/VISTARIL) 10 MG tablet Take 10 mg by mouth every 8 (eight) hours as needed for itching.    Historical Provider, MD  L-Methylfolate-B12-B6-B2 (CEREFOLIN PO) Take 1 tablet by mouth daily.    Historical Provider, MD  loperamide (IMODIUM) 2 MG capsule Take 2 mg by mouth as needed for diarrhea or loose stools. Max 8 doses in 24 hrs    Historical Provider, MD  magnesium hydroxide (MILK OF MAGNESIA) 400 MG/5ML suspension Take 30 mLs by mouth at bedtime as needed for mild constipation.    Historical Provider, MD  magnesium oxide (MAG-OX) 400 MG tablet Take 400 mg by mouth daily.    Historical Provider, MD  neomycin-bacitracin-polymyxin (NEOSPORIN) ointment Apply 1 application topically as needed for wound care. apply to eye    Historical Provider, MD  senna (SENOKOT) 8.6 MG tablet Take 1 tablet by mouth daily.    Historical Provider, MD    Family History Family History  Problem Relation Age of Onset  . Dementia Mother   . Healthy Father     Social History Social History  Substance Use Topics  . Smoking status: Never Smoker  . Smokeless tobacco: Never Used  .  Alcohol use No     Allergies   Review of patient's allergies indicates no known allergies.   Review of Systems Review of Systems  Unable to perform ROS: Dementia     Physical Exam Updated Vital Signs BP 133/66 (BP Location: Left Arm)   Pulse 60   Temp 97.8 F (36.6 C) (Oral)   Resp 18   SpO2 100%   Physical Exam  Constitutional: She appears well-developed and well-nourished. No distress.  HENT:  Small frontal forehead scalp hematoma without abrasion or laceration  Eyes: EOM are normal.  Neck: Normal range of motion. Neck supple.  Mild cervical and paracervical tenderness without cervical step-off.    Cardiovascular: Normal rate, regular rhythm and normal heart sounds.   Pulmonary/Chest: Effort normal and breath sounds normal.  Abdominal: Soft. She exhibits no distension. There is no tenderness.  Musculoskeletal: Normal range of motion.  Full range of motion bilateral hips, knees, ankles.  Full range of motion bilateral shoulders, elbows, wrists  Neurological: She is alert.  Skin: Skin is warm and dry.  Psychiatric: She has a normal mood and affect. Judgment normal.  Nursing note and vitals reviewed.    ED Treatments / Results  Labs (all labs ordered are listed, but only abnormal results are displayed) Labs Reviewed - No data to display  EKG  EKG Interpretation None       Radiology Ct Head Wo Contrast  Result Date: 01/22/2016 CLINICAL DATA:  Status post fall out of a wheelchair today. EXAM: CT HEAD WITHOUT CONTRAST CT CERVICAL SPINE WITHOUT CONTRAST TECHNIQUE: Multidetector CT imaging of the head and cervical spine was performed following the standard protocol without intravenous contrast. Multiplanar CT image reconstructions of the cervical spine were also generated. COMPARISON:  Head and cervical spine CT scans 10/24/2015. FINDINGS: CT HEAD FINDINGS Soft tissue contusion over the frontal bone seen on the most recent study has improved. Marked cortical atrophy and extensive chronic microvascular ischemic change are seen. No evidence of acute abnormality including hemorrhage, infarct, mass lesion, mass effect, midline shift or abnormal extra-axial fluid collection. No hydrocephalus or pneumocephalus. The calvarium is intact. Left mastoid effusion is unchanged. Atherosclerotic vascular disease is noted. CT CERVICAL SPINE FINDINGS 0.3 cm anterolisthesis C4 on C5 due to facet arthropathy is unchanged. Alignment is otherwise unremarkable. No fracture. Severe loss of disc space height is seen from C5 into the upper thoracic spine, unchanged. Lung apices are clear. IMPRESSION: No acute  abnormality head or cervical spine. Frontal hematoma is improved since the prior exam. Marked atrophy and extensive chronic microvascular ischemic change, unchanged. No change in a left mastoid effusion. No change in advanced multilevel cervical spondylosis. Electronically Signed   By: Drusilla Kanner M.D.   On: 01/22/2016 11:55   Ct Cervical Spine Wo Contrast  Result Date: 01/22/2016 CLINICAL DATA:  Status post fall out of a wheelchair today. EXAM: CT HEAD WITHOUT CONTRAST CT CERVICAL SPINE WITHOUT CONTRAST TECHNIQUE: Multidetector CT imaging of the head and cervical spine was performed following the standard protocol without intravenous contrast. Multiplanar CT image reconstructions of the cervical spine were also generated. COMPARISON:  Head and cervical spine CT scans 10/24/2015. FINDINGS: CT HEAD FINDINGS Soft tissue contusion over the frontal bone seen on the most recent study has improved. Marked cortical atrophy and extensive chronic microvascular ischemic change are seen. No evidence of acute abnormality including hemorrhage, infarct, mass lesion, mass effect, midline shift or abnormal extra-axial fluid collection. No hydrocephalus or pneumocephalus. The calvarium is intact. Left mastoid  effusion is unchanged. Atherosclerotic vascular disease is noted. CT CERVICAL SPINE FINDINGS 0.3 cm anterolisthesis C4 on C5 due to facet arthropathy is unchanged. Alignment is otherwise unremarkable. No fracture. Severe loss of disc space height is seen from C5 into the upper thoracic spine, unchanged. Lung apices are clear. IMPRESSION: No acute abnormality head or cervical spine. Frontal hematoma is improved since the prior exam. Marked atrophy and extensive chronic microvascular ischemic change, unchanged. No change in a left mastoid effusion. No change in advanced multilevel cervical spondylosis. Electronically Signed   By: Drusilla Kanner M.D.   On: 01/22/2016 11:55    Procedures Procedures (including  critical care time)  Medications Ordered in ED Medications - No data to display   Initial Impression / Assessment and Plan / ED Course  I have reviewed the triage vital signs and the nursing notes.  Pertinent labs & imaging results that were available during my care of the patient were reviewed by me and considered in my medical decision making (see chart for details).  Clinical Course    Acute imaging without pathology.  Discharge home in good condition.  Full range of motion bilateral hips and knees.  Final Clinical Impressions(s) / ED Diagnoses   Final diagnoses:  Fall, initial encounter  Scalp hematoma, initial encounter    New Prescriptions New Prescriptions   No medications on file     Azalia Bilis, MD 01/22/16 1209

## 2016-01-22 NOTE — ED Notes (Signed)
Bed: [id number] Expected date:  Expected time:  Means of arrival:  Comments: EMS- 75yo F, fall/head injury

## 2016-01-22 NOTE — ED Notes (Signed)
[facility]

## 2016-01-22 NOTE — ED Notes (Signed)
[facility] notified of transportation need 

## 2016-01-28 ENCOUNTER — Encounter: Payer: Self-pay | Admitting: Cardiovascular Disease

## 2016-02-01 ENCOUNTER — Encounter (HOSPITAL_COMMUNITY): Payer: Self-pay | Admitting: Emergency Medicine

## 2016-02-01 ENCOUNTER — Emergency Department (HOSPITAL_COMMUNITY)
Admission: EM | Admit: 2016-02-01 | Discharge: 2016-02-01 | Disposition: A | Discharge status: Home / Self Care | Payer: Medicare Other | Attending: Emergency Medicine | Admitting: Emergency Medicine

## 2016-02-01 DIAGNOSIS — Z791 Long term (current) use of non-steroidal anti-inflammatories (NSAID): Secondary | ICD-10-CM | POA: Diagnosis not present

## 2016-02-01 DIAGNOSIS — I10 Essential (primary) hypertension: Secondary | ICD-10-CM | POA: Insufficient documentation

## 2016-02-01 DIAGNOSIS — Z950 Presence of cardiac pacemaker: Secondary | ICD-10-CM | POA: Insufficient documentation

## 2016-02-01 DIAGNOSIS — Y929 Unspecified place or not applicable: Secondary | ICD-10-CM | POA: Diagnosis not present

## 2016-02-01 DIAGNOSIS — E119 Type 2 diabetes mellitus without complications: Secondary | ICD-10-CM | POA: Diagnosis not present

## 2016-02-01 DIAGNOSIS — F0390 Unspecified dementia without behavioral disturbance: Secondary | ICD-10-CM | POA: Diagnosis not present

## 2016-02-01 DIAGNOSIS — Y939 Activity, unspecified: Secondary | ICD-10-CM | POA: Insufficient documentation

## 2016-02-01 DIAGNOSIS — R251 Tremor, unspecified: Secondary | ICD-10-CM | POA: Diagnosis present

## 2016-02-01 DIAGNOSIS — W19XXXA Unspecified fall, initial encounter: Secondary | ICD-10-CM

## 2016-02-01 DIAGNOSIS — W050XXA Fall from non-moving wheelchair, initial encounter: Secondary | ICD-10-CM | POA: Diagnosis not present

## 2016-02-01 DIAGNOSIS — Y999 Unspecified external cause status: Secondary | ICD-10-CM | POA: Diagnosis not present

## 2016-02-01 HISTORY — DX: Major depressive disorder, single episode, unspecified: F32.9

## 2016-02-01 HISTORY — DX: Depression, unspecified: F32.A

## 2016-02-01 NOTE — ED Notes (Signed)
MD at bedside. 

## 2016-02-01 NOTE — Discharge Instructions (Signed)
You suffered a fall from a wheelchair. There are no signs of any traumatic injuries. If you notice any changes to your mental status, please return to the emergency department for further evaluation.

## 2016-02-01 NOTE — ED Provider Notes (Signed)
[facility]-EMERGENCY DEPT Provider Note   CSN: [id number] Arrival date & time: 02/01/16  0735     History   Chief Complaint Chief Complaint  Patient presents with  . Fall    HPI Paige Flores is a 75 y.o. female.  Patient Hospital history of dementia, hypertension, diabetes or lives at a nursing home and is wheelchair-bound at baseline presents for follow-up from her wheelchair earlier this morning. She was noted by EMS to have some jerks and tremors which caused her to fall from her wheelchair. Reportedly she did hit her head but there is no signs of traumatic injury at this time. Patient is unable to contribute much to her history due to her underlying dementia. Patient presented multiple times over the past year for similar complaints, she has a negative CT imaging of the head and neck during those visits. Patient is not currently on any anticoagulation. She is in no acute distress. She vocalizes but with nonsensical language. She appears pleasant and is reportedly at her neurological baseline.      Past Medical History:  Diagnosis Date  . Aphasia   . Bradycardia   . Dementia   . Depression   . Diabetes mellitus without complication (HCC)   . Hypertension     Patient Active Problem List   Diagnosis Date Noted  . Paroxysmal atrial tachycardia (HCC) 01/03/2016  . SSS (sick sinus syndrome) (HCC) 01/03/2016  . Pacemaker 01/03/2016  . Murmur 01/03/2016  . Hyperlipidemia 01/03/2016    History reviewed. No pertinent surgical history.  OB History    No data available       Home Medications    Prior to Admission medications   Medication Sig Start Date End Date Taking? Authorizing Provider  acetaminophen (TYLENOL) 500 MG tablet Take 500 mg by mouth every 4 (four) hours as needed for mild pain or headache.    Historical Provider, MD  alum & mag hydroxide-simeth (MINTOX) 200-200-20 MG/5ML suspension Take 30 mLs by mouth as needed for indigestion or heartburn. Max 4 doses per  24 hours    Historical Provider, MD  cholecalciferol (VITAMIN D) 1000 units tablet Take 1,000 Units by mouth daily.    Historical Provider, MD  diltiazem (CARDIZEM CD) 180 MG 24 hr capsule Take 180 mg by mouth daily.    Historical Provider, MD  escitalopram (LEXAPRO) 10 MG tablet Take 10 mg by mouth daily.    Historical Provider, MD  fenofibrate micronized (LOFIBRA) 134 MG capsule Take 134 mg by mouth daily before breakfast.    Historical Provider, MD  guaifenesin (ROBITUSSIN) 100 MG/5ML syrup Take 200 mg by mouth every 6 (six) hours as needed for cough.    Historical Provider, MD  hydrOXYzine (ATARAX/VISTARIL) 10 MG tablet Take 10 mg by mouth every 8 (eight) hours as needed for itching.    Historical Provider, MD  L-Methylfolate-B12-B6-B2 (CEREFOLIN PO) Take 1 tablet by mouth daily.    Historical Provider, MD  loperamide (IMODIUM) 2 MG capsule Take 2 mg by mouth as needed for diarrhea or loose stools. Max 8 doses in 24 hrs    Historical Provider, MD  magnesium hydroxide (MILK OF MAGNESIA) 400 MG/5ML suspension Take 30 mLs by mouth at bedtime as needed for mild constipation.    Historical Provider, MD  magnesium oxide (MAG-OX) 400 MG tablet Take 400 mg by mouth daily.    Historical Provider, MD  neomycin-bacitracin-polymyxin (NEOSPORIN) ointment Apply 1 application topically as needed for wound care. apply to eye    Historical  Provider, MD  senna (SENOKOT) 8.6 MG tablet Take 1 tablet by mouth daily.    Historical Provider, MD    Family History Family History  Problem Relation Age of Onset  . Dementia Mother   . Healthy Father     Social History Social History  Substance Use Topics  . Smoking status: Never Smoker  . Smokeless tobacco: Never Used  . Alcohol use No     Allergies   Review of patient's allergies indicates no known allergies.   Review of Systems Review of Systems  Unable to perform ROS: Dementia     Physical Exam Updated Vital Signs BP 134/81 (BP Location: Right  Arm)   Pulse 60   Temp 97.6 F (36.4 C) (Oral)   Resp 15   Ht 5\' 3"  (1.6 m)   Wt 59 kg   SpO2 98%   BMI 23.03 kg/m   Physical Exam  Constitutional: She appears well-developed and well-nourished. No distress.  HENT:  Head: Normocephalic and atraumatic. Head is without raccoon's eyes, without Battle's sign, without abrasion and without contusion.  Nose: Nose normal.  Mouth/Throat: Oropharynx is clear and moist. Abnormal dentition (several missing teeth).  Eyes: Conjunctivae are normal. Pupils are equal, round, and reactive to light.  Neck: Normal range of motion. Neck supple.  Cardiovascular: Normal rate, regular rhythm, normal heart sounds and intact distal pulses.   Pulmonary/Chest: Effort normal and breath sounds normal. No respiratory distress.  Abdominal: Soft. There is no tenderness.  Musculoskeletal: Normal range of motion. She exhibits no edema, tenderness or deformity.  Lymphadenopathy:    She has no cervical adenopathy.  Neurological: She is alert. She is disoriented.  Skin: Skin is warm and dry.  Psychiatric: She has a normal mood and affect. Cognition and memory are impaired. She is noncommunicative.  Nursing note and vitals reviewed.    ED Treatments / Results  Labs (all labs ordered are listed, but only abnormal results are displayed) Labs Reviewed - No data to display  EKG  EKG Interpretation None       Radiology No results found.  Procedures Procedures (including critical care time)  Medications Ordered in ED Medications - No data to display   Initial Impression / Assessment and Plan / ED Course  I have reviewed the triage vital signs and the nursing notes.  Pertinent labs & imaging results that were available during my care of the patient were reviewed by me and considered in my medical decision making (see chart for details).  Clinical Course    Patient seen and evaluated without any significant signs of traumatic injury. She has no focal  tenderness over her cervical spine or scalp, no step-offs or deformities. She has no bruising, lacerations, or abrasions. She is reportedly at her baseline mental status. She moves her arms and legs spontaneously. Given her benign examination, we will recommend discharge to home without further workup.  Final Clinical Impressions(s) / ED Diagnoses   Final diagnoses:  Fall, initial encounter    New Prescriptions New Prescriptions   No medications on file     Carolynn CommentBryan Mariah Harn, MD 02/01/16 [id number]    Gwyneth SproutWhitney Plunkett, MD 02/01/16 2051

## 2016-02-01 NOTE — ED Notes (Signed)
[facility] notified of transportation need 

## 2016-02-01 NOTE — ED Notes (Signed)
Attempt[facility] to [facility] with [facility].

## 2016-02-01 NOTE — ED Notes (Signed)
[facility] at bedside to [facility]

## 2016-02-01 NOTE — ED Triage Notes (Addendum)
Per EMS, patient has hx of tremors and sometimes jerks. Patient was sitting in wheelchair and fell forward; witnessed fall by staff. Staff reports patient hit head. Patient does not use blood thinners. Patient does not appear to be in pain at this time. Hx of dementia.

## 2016-02-01 NOTE — ED Notes (Signed)
Bed: [id number] Expected date:  Expected time:  Means of arrival:  Comments: 75 yo fall from wheelchair

## 2016-02-29 ENCOUNTER — Emergency Department (HOSPITAL_COMMUNITY)
Admission: EM | Admit: 2016-02-29 | Discharge: 2016-02-29 | Disposition: A | Discharge status: Home / Self Care | Payer: Medicare Other | Attending: Physician Assistant | Admitting: Physician Assistant

## 2016-02-29 ENCOUNTER — Encounter (HOSPITAL_COMMUNITY): Payer: Self-pay | Admitting: Nurse Practitioner

## 2016-02-29 ENCOUNTER — Emergency Department (HOSPITAL_COMMUNITY): Payer: Medicare Other

## 2016-02-29 DIAGNOSIS — E119 Type 2 diabetes mellitus without complications: Secondary | ICD-10-CM | POA: Diagnosis not present

## 2016-02-29 DIAGNOSIS — I10 Essential (primary) hypertension: Secondary | ICD-10-CM | POA: Insufficient documentation

## 2016-02-29 DIAGNOSIS — Z950 Presence of cardiac pacemaker: Secondary | ICD-10-CM | POA: Diagnosis not present

## 2016-02-29 DIAGNOSIS — Z043 Encounter for examination and observation following other accident: Secondary | ICD-10-CM | POA: Insufficient documentation

## 2016-02-29 DIAGNOSIS — Y92128 Other place in nursing home as the place of occurrence of the external cause: Secondary | ICD-10-CM | POA: Insufficient documentation

## 2016-02-29 DIAGNOSIS — R930 Abnormal findings on diagnostic imaging of skull and head, not elsewhere classified: Secondary | ICD-10-CM | POA: Insufficient documentation

## 2016-02-29 DIAGNOSIS — W19XXXA Unspecified fall, initial encounter: Secondary | ICD-10-CM | POA: Insufficient documentation

## 2016-02-29 DIAGNOSIS — Z79899 Other long term (current) drug therapy: Secondary | ICD-10-CM | POA: Diagnosis not present

## 2016-02-29 DIAGNOSIS — Y939 Activity, unspecified: Secondary | ICD-10-CM | POA: Diagnosis not present

## 2016-02-29 DIAGNOSIS — Y999 Unspecified external cause status: Secondary | ICD-10-CM | POA: Insufficient documentation

## 2016-02-29 NOTE — Discharge Instructions (Signed)
We did not seen any size or signs of injury on the patient. Please have her return with any concerns. Please return with any nausea vomiting diarrhea fevers or confusion or concerns.

## 2016-02-29 NOTE — ED Notes (Signed)
Unable to assess patient's pain level due to patient not communicating with nurse.

## 2016-02-29 NOTE — ED Triage Notes (Signed)
Per EMS, patient has hx of tremors and sometimes jerks. Patient was sitting in wheelchair and fell forward while in the dining room; witnessed fall by staff. Staff reports patient may have hit head. Patient does not use blood thinners. Patient does not appear to be in pain at this time. Smiling. Calm. Hx of dementia. [facility]

## 2016-02-29 NOTE — ED Notes (Signed)
Bed: WHALD Expected date:  Expected time:  Means of arrival:  Comments: 

## 2016-02-29 NOTE — ED Provider Notes (Signed)
[facility]-EMERGENCY DEPT Provider Note   CSN: [id number] Arrival date & time: 02/29/16  [id number]     History   Chief Complaint Chief Complaint  Patient presents with  . Fall    HPI Edwinna AreolaDaisy Greth is a 75 y.o. female.  HPI   Patient is a 75 year old female with advanced dementia. Unable to get any history from patient. Nursing home reportsfall and hitting her head on the dining room table. No evidence of trauma. It was witnessed.  L5 caveat dementia.  Past Medical History:  Diagnosis Date  . Aphasia   . Bradycardia   . Dementia   . Depression   . Diabetes mellitus without complication (HCC)   . Hypertension     Patient Active Problem List   Diagnosis Date Noted  . Paroxysmal atrial tachycardia (HCC) 01/03/2016  . SSS (sick sinus syndrome) (HCC) 01/03/2016  . Pacemaker 01/03/2016  . Murmur 01/03/2016  . Hyperlipidemia 01/03/2016    History reviewed. No pertinent surgical history.  OB History    No data available       Home Medications    Prior to Admission medications   Medication Sig Start Date End Date Taking? Authorizing Provider  acetaminophen (TYLENOL) 500 MG tablet Take 500 mg by mouth every 4 (four) hours as needed for mild pain or headache.    Historical Provider, MD  alum & mag hydroxide-simeth (MINTOX) 200-200-20 MG/5ML suspension Take 30 mLs by mouth as needed for indigestion or heartburn. Max 4 doses per 24 hours    Historical Provider, MD  cholecalciferol (VITAMIN D) 1000 units tablet Take 1,000 Units by mouth daily.    Historical Provider, MD  diltiazem (CARDIZEM CD) 180 MG 24 hr capsule Take 180 mg by mouth daily.    Historical Provider, MD  escitalopram (LEXAPRO) 10 MG tablet Take 10 mg by mouth daily.    Historical Provider, MD  fenofibrate micronized (LOFIBRA) 134 MG capsule Take 134 mg by mouth daily before breakfast.    Historical Provider, MD  guaifenesin (ROBITUSSIN) 100 MG/5ML syrup Take 200 mg by mouth every 6 (six) hours as needed for cough.     Historical Provider, MD  hydrOXYzine (ATARAX/VISTARIL) 10 MG tablet Take 10 mg by mouth every 8 (eight) hours as needed for itching.    Historical Provider, MD  L-Methylfolate-B12-B6-B2 (CEREFOLIN PO) Take 1 tablet by mouth daily.    Historical Provider, MD  loperamide (IMODIUM) 2 MG capsule Take 2 mg by mouth as needed for diarrhea or loose stools. Max 8 doses in 24 hrs    Historical Provider, MD  magnesium hydroxide (MILK OF MAGNESIA) 400 MG/5ML suspension Take 30 mLs by mouth at bedtime as needed for mild constipation.    Historical Provider, MD  magnesium oxide (MAG-OX) 400 MG tablet Take 400 mg by mouth daily.    Historical Provider, MD  neomycin-bacitracin-polymyxin (NEOSPORIN) ointment Apply 1 application topically as needed for wound care. apply to eye    Historical Provider, MD  senna (SENOKOT) 8.6 MG tablet Take 1 tablet by mouth daily.    Historical Provider, MD    Family History Family History  Problem Relation Age of Onset  . Dementia Mother   . Healthy Father     Social History Social History  Substance Use Topics  . Smoking status: Never Smoker  . Smokeless tobacco: Never Used  . Alcohol use No     Allergies   Review of patient's allergies indicates no known allergies.   Review of Systems Review of Systems  Unable to perform ROS: Dementia     Physical Exam Updated Vital Signs BP 145/90 (BP Location: Left Arm)   Pulse 78   Temp 97.4 F (36.3 C) (Oral)   Resp 18   SpO2 97%   Physical Exam  Constitutional: She appears well-developed and well-nourished.  HENT:  Head: Normocephalic and atraumatic.  Eyes: Conjunctivae are normal. Right eye exhibits no discharge.  Neck: Neck supple.  Cardiovascular: Normal rate and regular rhythm.   No murmur heard. Pulmonary/Chest: Effort normal and breath sounds normal. She has no wheezes. She has no rales.  Abdominal: Soft. She exhibits no distension. There is no tenderness.  Musculoskeletal: Normal range of motion.  She exhibits no edema.  Neurological: She is alert. No cranial nerve deficit.  Skin: Skin is warm and dry. No rash noted. She is not diaphoretic.  No evidence of trauma.  Psychiatric:  Oriented 0.  Nursing note and vitals reviewed.    ED Treatments / Results  Labs (all labs ordered are listed, but only abnormal results are displayed) Labs Reviewed - No data to display  EKG  EKG Interpretation None       Radiology Ct Head Wo Contrast  Result Date: 02/29/2016 CLINICAL DATA:  Fall today.  Initial encounter. EXAM: CT HEAD WITHOUT CONTRAST CT CERVICAL SPINE WITHOUT CONTRAST TECHNIQUE: Multidetector CT imaging of the head and cervical spine was performed following the standard protocol without intravenous contrast. Multiplanar CT image reconstructions of the cervical spine were also generated. COMPARISON:  01/22/2016 FINDINGS: CT HEAD FINDINGS Brain: No evidence of acute infarction, hemorrhage, hydrocephalus, extra-axial collection or mass lesion/mass effect. The brain shows stable global atrophy and underlying small vessel disease with old lacunar infarcts. Vascular: No hyperdense vessel or unexpected calcification. Skull: Normal. Negative for fracture or focal lesion. Suggestion of mild soft tissue swelling in the right frontal region. Sinuses/Orbits: No acute finding. CT CERVICAL SPINE FINDINGS Alignment: Stable degenerative anterolisthesis of C4 on C5. Skull base and vertebrae: No acute fracture. No primary bone lesion or focal pathologic process. Soft tissues and spinal canal: No prevertebral fluid or swelling. No visible canal hematoma. Disc levels: Stable advanced spondylosis throughout the cervical spine, most prominently at C5-6, C6-7 and C7-T1. Upper chest: Negative. IMPRESSION: No acute injuries identified in the head or cervical spine. Electronically Signed   By: Irish Lack M.D.   On: 02/29/2016 11:01   Ct Cervical Spine Wo Contrast  Result Date: 02/29/2016 CLINICAL DATA:   Fall today.  Initial encounter. EXAM: CT HEAD WITHOUT CONTRAST CT CERVICAL SPINE WITHOUT CONTRAST TECHNIQUE: Multidetector CT imaging of the head and cervical spine was performed following the standard protocol without intravenous contrast. Multiplanar CT image reconstructions of the cervical spine were also generated. COMPARISON:  01/22/2016 FINDINGS: CT HEAD FINDINGS Brain: No evidence of acute infarction, hemorrhage, hydrocephalus, extra-axial collection or mass lesion/mass effect. The brain shows stable global atrophy and underlying small vessel disease with old lacunar infarcts. Vascular: No hyperdense vessel or unexpected calcification. Skull: Normal. Negative for fracture or focal lesion. Suggestion of mild soft tissue swelling in the right frontal region. Sinuses/Orbits: No acute finding. CT CERVICAL SPINE FINDINGS Alignment: Stable degenerative anterolisthesis of C4 on C5. Skull base and vertebrae: No acute fracture. No primary bone lesion or focal pathologic process. Soft tissues and spinal canal: No prevertebral fluid or swelling. No visible canal hematoma. Disc levels: Stable advanced spondylosis throughout the cervical spine, most prominently at C5-6, C6-7 and C7-T1. Upper chest: Negative. IMPRESSION: No acute injuries identified in the  head or cervical spine. Electronically Signed   By: Irish Lack M.D.   On: 02/29/2016 11:01    Procedures Procedures (including critical care time)  Medications Ordered in ED Medications - No data to display   Initial Impression / Assessment and Plan / ED Course  I have reviewed the triage vital signs and the nursing notes.  Pertinent labs & imaging results that were available during my care of the patient were reviewed by me and considered in my medical decision making (see chart for details).  Clinical Course   Patient is a 75 year old female with history of dementia presenting after mechanical fall at nursing home that was witnessed. We will do CT  head and neck given the history of striking her head however they see no evidence of external trauma. No evidence of trauma otherwise if CT head and neck are normal, patient appears to be baseline and has normal vital signs and physical exam otherwise we'll discharge home.  .11:15 AM CT negative.   Final Clinical Impressions(s) / ED Diagnoses   Final diagnoses:  Fall, initial encounter    New Prescriptions New Prescriptions   No medications on file     Ty Buntrock Randall An, MD 02/29/16 1115

## 2016-03-14 ENCOUNTER — Emergency Department (HOSPITAL_COMMUNITY): Payer: Medicare Other

## 2016-03-14 ENCOUNTER — Emergency Department (HOSPITAL_COMMUNITY)
Admission: EM | Admit: 2016-03-14 | Discharge: 2016-03-14 | Disposition: A | Discharge status: Home / Self Care | Payer: Medicare Other | Source: Home / Self Care | Attending: Emergency Medicine | Admitting: Emergency Medicine

## 2016-03-14 ENCOUNTER — Encounter (HOSPITAL_COMMUNITY): Payer: Self-pay

## 2016-03-14 DIAGNOSIS — W19XXXA Unspecified fall, initial encounter: Secondary | ICD-10-CM

## 2016-03-14 DIAGNOSIS — Y929 Unspecified place or not applicable: Secondary | ICD-10-CM

## 2016-03-14 DIAGNOSIS — E119 Type 2 diabetes mellitus without complications: Secondary | ICD-10-CM | POA: Insufficient documentation

## 2016-03-14 DIAGNOSIS — Y939 Activity, unspecified: Secondary | ICD-10-CM | POA: Insufficient documentation

## 2016-03-14 DIAGNOSIS — Y999 Unspecified external cause status: Secondary | ICD-10-CM

## 2016-03-14 DIAGNOSIS — S0003XA Contusion of scalp, initial encounter: Secondary | ICD-10-CM

## 2016-03-14 DIAGNOSIS — I10 Essential (primary) hypertension: Secondary | ICD-10-CM

## 2016-03-14 DIAGNOSIS — F0390 Unspecified dementia without behavioral disturbance: Secondary | ICD-10-CM | POA: Insufficient documentation

## 2016-03-14 NOTE — ED Provider Notes (Signed)
[facility]ER[facility] DEPT Provider Note   CSN: [id number] Arrival date & time: 03/14/16  [id number]     History   Chief Complaint Chief Complaint  Patient presents with  . Fall    HPI Paige Flores is a 75 y.o. female.  This is 75 year old female who lives in a dementia memory care unit who has had numerous recent falls.  EMS was called to the facility for an unwitnessed fall.  Patient was found on the floor next to her bed on the mat that is in the lowest possible position with mats on either side.  She is nonverbal at baseline      Past Medical History:  Diagnosis Date  . Aphasia   . Bradycardia   . Dementia   . Depression   . Diabetes mellitus without complication (HCC)   . Hypertension     Patient Active Problem List   Diagnosis Date Noted  . Paroxysmal atrial tachycardia (HCC) 01/03/2016  . SSS (sick sinus syndrome) (HCC) 01/03/2016  . Pacemaker 01/03/2016  . Murmur 01/03/2016  . Hyperlipidemia 01/03/2016    History reviewed. No pertinent surgical history.  OB History    No data available       Home Medications    Prior to Admission medications   Medication Sig Start Date End Date Taking? Authorizing Provider  acetaminophen (TYLENOL) 500 MG tablet Take 500 mg by mouth every 4 (four) hours as needed for mild pain or headache.    Historical Provider, MD  alum & mag hydroxide-simeth (MINTOX) 200-200-20 MG/5ML suspension Take 30 mLs by mouth as needed for indigestion or heartburn. Max 4 doses per 24 hours    Historical Provider, MD  cholecalciferol (VITAMIN D) 1000 units tablet Take 1,000 Units by mouth daily.    Historical Provider, MD  diltiazem (CARDIZEM CD) 180 MG 24 hr capsule Take 180 mg by mouth daily.    Historical Provider, MD  escitalopram (LEXAPRO) 10 MG tablet Take 10 mg by mouth daily.    Historical Provider, MD  fenofibrate micronized (LOFIBRA) 134 MG capsule Take 134 mg by mouth daily before breakfast.    Historical Provider, MD  guaifenesin  (ROBITUSSIN) 100 MG/5ML syrup Take 200 mg by mouth every 6 (six) hours as needed for cough.    Historical Provider, MD  hydrOXYzine (ATARAX/VISTARIL) 10 MG tablet Take 10 mg by mouth every 8 (eight) hours as needed for itching.    Historical Provider, MD  L-Methylfolate-B12-B6-B2 (CEREFOLIN PO) Take 1 tablet by mouth daily.    Historical Provider, MD  loperamide (IMODIUM) 2 MG capsule Take 2 mg by mouth as needed for diarrhea or loose stools. Max 8 doses in 24 hrs    Historical Provider, MD  magnesium hydroxide (MILK OF MAGNESIA) 400 MG/5ML suspension Take 30 mLs by mouth at bedtime as needed for mild constipation.    Historical Provider, MD  magnesium oxide (MAG-OX) 400 MG tablet Take 400 mg by mouth daily.    Historical Provider, MD  neomycin-bacitracin-polymyxin (NEOSPORIN) ointment Apply 1 application topically as needed for wound care. apply to eye    Historical Provider, MD  senna (SENOKOT) 8.6 MG tablet Take 1 tablet by mouth daily.    Historical Provider, MD    Family History Family History  Problem Relation Age of Onset  . Dementia Mother   . Healthy Father     Social History Social History  Substance Use Topics  . Smoking status: Never Smoker  . Smokeless tobacco: Never Used  . Alcohol use  No     Allergies   Review of patient's allergies indicates no known allergies.   Review of Systems Review of Systems  Unable to perform ROS: Dementia     Physical Exam Updated Vital Signs BP 147/89   Pulse 74   Temp 97.7 F (36.5 C)   Resp 18   SpO2 99%   Physical Exam  Constitutional: She appears well-developed and well-nourished. No distress.  HENT:  Head: Normocephalic and atraumatic.  Right Ear: External ear normal.  Left Ear: External ear normal.  Eyes: Pupils are equal, round, and reactive to light.  Neck: Normal range of motion.  Cardiovascular: Normal rate.   Pulmonary/Chest: Effort normal.  Abdominal: Soft. Bowel sounds are normal.  Musculoskeletal: She  exhibits no tenderness or deformity.  All extremities passively taken through range of motion.  She has stiffness at the elbow, knee and hip, no deformity, bruising or breaks in the skin noted  Neurological:  Eyes are open.  Patient smiles to verbal stimuli  Skin: Skin is warm and dry. There is pallor.  Nursing note and vitals reviewed.    ED Treatments / Results  Labs (all labs ordered are listed, but only abnormal results are displayed) Labs Reviewed - No data to display  EKG  EKG Interpretation None       Radiology Ct Head Wo Contrast  Result Date: 03/14/2016 CLINICAL DATA:  Found down beside bed on floor mat. No apparent injury. History of dementia, hypertension and diabetes. EXAM: CT HEAD WITHOUT CONTRAST CT CERVICAL SPINE WITHOUT CONTRAST TECHNIQUE: Multidetector CT imaging of the head and cervical spine was performed following the standard protocol without intravenous contrast. Multiplanar CT image reconstructions of the cervical spine were also generated. COMPARISON:  CT HEAD and cervical spine February 29, 2016 FINDINGS: CT HEAD FINDINGS BRAIN: Severe ventriculomegaly on the basis of global parenchymal brain volume loss No intraparenchymal hemorrhage, mass effect nor midline shift. Patchy supratentorial white matter hypodensities within normal range for patient's age, though non-specific are most compatible with chronic small vessel ischemic disease. No acute large vascular territory infarcts. No abnormal extra-axial fluid collections. Basal cisterns are patent. VASCULAR: Mild calcific atherosclerosis of the carotid siphons. SKULL: No skull fracture. Small residual bifrontal scalp hematoma. No subcutaneous gas or radiopaque foreign bodies. SINUSES/ORBITS: Mild paranasal sinus mucosal thickening and scattered air-fluid levels. Small LEFT mastoid effusion. Soft tissue within the bilateral external auditory canals most compatible with cerumen. The included ocular globes and orbital  contents are non-suspicious. OTHER: None. CT CERVICAL SPINE FINDINGS ALIGNMENT: Maintained lordosis. Stable grade 1 C4-5 anterolisthesis. SKULL BASE AND VERTEBRAE: Cervical vertebral bodies and posterior elements are intact. Severe C5-6 through T1-2 disc height loss, endplate spurring compatible with degenerative discs. Moderate C3-4 degenerative disc. Moderate to severe upper cervical facet arthropathy. C1-2 articulation maintained with mild arthropathy. Osteopenia. No destructive bony lesions. SOFT TISSUES AND SPINAL CANAL: Normal. DISC LEVELS: No significant osseous canal stenosis. Moderate to severe C3-4, moderate C4-5, moderate to severe C5-6 neural foraminal narrowing. UPPER CHEST: Lung apices are clear. OTHER: None. IMPRESSION: CT HEAD: No acute intracranial process. Small residual frontal scalp hematoma. No skull fracture. Stable chronic changes including moderate to severe global brain atrophy and moderate to severe chronic small vessel ischemic disease. CT CERVICAL SPINE: No acute fracture. Stable grade 1 C4-5 anterolisthesis on degenerative basis. Electronically Signed   By: Awilda Metroourtnay  Bloomer M.D.   On: 03/14/2016 04:04   Ct Cervical Spine Wo Contrast  Result Date: 03/14/2016 CLINICAL DATA:  Found down  beside bed on floor mat. No apparent injury. History of dementia, hypertension and diabetes. EXAM: CT HEAD WITHOUT CONTRAST CT CERVICAL SPINE WITHOUT CONTRAST TECHNIQUE: Multidetector CT imaging of the head and cervical spine was performed following the standard protocol without intravenous contrast. Multiplanar CT image reconstructions of the cervical spine were also generated. COMPARISON:  CT HEAD and cervical spine February 29, 2016 FINDINGS: CT HEAD FINDINGS BRAIN: Severe ventriculomegaly on the basis of global parenchymal brain volume loss No intraparenchymal hemorrhage, mass effect nor midline shift. Patchy supratentorial white matter hypodensities within normal range for patient's age, though  non-specific are most compatible with chronic small vessel ischemic disease. No acute large vascular territory infarcts. No abnormal extra-axial fluid collections. Basal cisterns are patent. VASCULAR: Mild calcific atherosclerosis of the carotid siphons. SKULL: No skull fracture. Small residual bifrontal scalp hematoma. No subcutaneous gas or radiopaque foreign bodies. SINUSES/ORBITS: Mild paranasal sinus mucosal thickening and scattered air-fluid levels. Small LEFT mastoid effusion. Soft tissue within the bilateral external auditory canals most compatible with cerumen. The included ocular globes and orbital contents are non-suspicious. OTHER: None. CT CERVICAL SPINE FINDINGS ALIGNMENT: Maintained lordosis. Stable grade 1 C4-5 anterolisthesis. SKULL BASE AND VERTEBRAE: Cervical vertebral bodies and posterior elements are intact. Severe C5-6 through T1-2 disc height loss, endplate spurring compatible with degenerative discs. Moderate C3-4 degenerative disc. Moderate to severe upper cervical facet arthropathy. C1-2 articulation maintained with mild arthropathy. Osteopenia. No destructive bony lesions. SOFT TISSUES AND SPINAL CANAL: Normal. DISC LEVELS: No significant osseous canal stenosis. Moderate to severe C3-4, moderate C4-5, moderate to severe C5-6 neural foraminal narrowing. UPPER CHEST: Lung apices are clear. OTHER: None. IMPRESSION: CT HEAD: No acute intracranial process. Small residual frontal scalp hematoma. No skull fracture. Stable chronic changes including moderate to severe global brain atrophy and moderate to severe chronic small vessel ischemic disease. CT CERVICAL SPINE: No acute fracture. Stable grade 1 C4-5 anterolisthesis on degenerative basis. Electronically Signed   By: Awilda Metro M.D.   On: 03/14/2016 04:04    Procedures Procedures (including critical care time)  Medications Ordered in ED Medications - No data to display   Initial Impression / Assessment and Plan / ED Course  I  have reviewed the triage vital signs and the nursing notes.  Pertinent labs & imaging results that were available during my care of the patient were reviewed by me and considered in my medical decision making (see chart for details).  Clinical Course     Level V caveat applies to this patient.  Will CT head and neck due to mechanism of injury an unwitnessed fall  Final Clinical Impressions(s) / ED Diagnoses   Final diagnoses:  Fall, initial encounter  Contusion of scalp, initial encounter    New Prescriptions New Prescriptions   No medications on file     Earley Favor, NP 03/14/16 0410    Shon Baton, MD 03/14/16 [phone]

## 2016-03-14 NOTE — ED Notes (Signed)
Patient transported to CT 

## 2016-03-14 NOTE — Discharge Instructions (Signed)
Paige Flores had a CT scan of her head and cervical spine, both of which are normal

## 2016-03-14 NOTE — ED Triage Notes (Signed)
[facility] who stated pt had a fall found lying beside the bed on floor mat, as per EMS no signs of apparent injury. Pt is non verbal at base line C collar was in place upon pt arrival. Pt has no non verbal s/s of discomfort

## 2016-03-14 NOTE — ED Notes (Signed)
Bed: [id number] Expected date:  Expected time:  Means of arrival:  Comments: 75 yo F/ fall

## 2016-03-16 ENCOUNTER — Inpatient Hospital Stay (HOSPITAL_COMMUNITY)
Admission: EM | Admit: 2016-03-16 | Discharge: 2016-03-23 | DRG: 871 | Disposition: A | Discharge status: Skilled Nursing Facility | Payer: Medicare Other | Attending: Internal Medicine | Admitting: Internal Medicine

## 2016-03-16 ENCOUNTER — Encounter (HOSPITAL_COMMUNITY): Payer: Self-pay | Admitting: Emergency Medicine

## 2016-03-16 ENCOUNTER — Emergency Department (HOSPITAL_COMMUNITY): Payer: Medicare Other

## 2016-03-16 DIAGNOSIS — R7989 Other specified abnormal findings of blood chemistry: Secondary | ICD-10-CM | POA: Diagnosis present

## 2016-03-16 DIAGNOSIS — R569 Unspecified convulsions: Secondary | ICD-10-CM

## 2016-03-16 DIAGNOSIS — Z993 Dependence on wheelchair: Secondary | ICD-10-CM

## 2016-03-16 DIAGNOSIS — F0390 Unspecified dementia without behavioral disturbance: Secondary | ICD-10-CM | POA: Diagnosis present

## 2016-03-16 DIAGNOSIS — E872 Acidosis, unspecified: Secondary | ICD-10-CM | POA: Diagnosis present

## 2016-03-16 DIAGNOSIS — E119 Type 2 diabetes mellitus without complications: Secondary | ICD-10-CM | POA: Diagnosis present

## 2016-03-16 DIAGNOSIS — R778 Other specified abnormalities of plasma proteins: Secondary | ICD-10-CM | POA: Diagnosis present

## 2016-03-16 DIAGNOSIS — Y95 Nosocomial condition: Secondary | ICD-10-CM | POA: Diagnosis present

## 2016-03-16 DIAGNOSIS — I248 Other forms of acute ischemic heart disease: Secondary | ICD-10-CM | POA: Diagnosis present

## 2016-03-16 DIAGNOSIS — E876 Hypokalemia: Secondary | ICD-10-CM | POA: Diagnosis not present

## 2016-03-16 DIAGNOSIS — R410 Disorientation, unspecified: Secondary | ICD-10-CM | POA: Diagnosis not present

## 2016-03-16 DIAGNOSIS — F329 Major depressive disorder, single episode, unspecified: Secondary | ICD-10-CM | POA: Diagnosis present

## 2016-03-16 DIAGNOSIS — Z66 Do not resuscitate: Secondary | ICD-10-CM | POA: Diagnosis present

## 2016-03-16 DIAGNOSIS — I10 Essential (primary) hypertension: Secondary | ICD-10-CM | POA: Diagnosis present

## 2016-03-16 DIAGNOSIS — G9340 Encephalopathy, unspecified: Secondary | ICD-10-CM | POA: Diagnosis present

## 2016-03-16 DIAGNOSIS — Z950 Presence of cardiac pacemaker: Secondary | ICD-10-CM | POA: Diagnosis present

## 2016-03-16 DIAGNOSIS — Z79899 Other long term (current) drug therapy: Secondary | ICD-10-CM

## 2016-03-16 DIAGNOSIS — K7290 Hepatic failure, unspecified without coma: Secondary | ICD-10-CM | POA: Diagnosis present

## 2016-03-16 DIAGNOSIS — J9601 Acute respiratory failure with hypoxia: Secondary | ICD-10-CM | POA: Diagnosis present

## 2016-03-16 DIAGNOSIS — A419 Sepsis, unspecified organism: Principal | ICD-10-CM | POA: Diagnosis present

## 2016-03-16 DIAGNOSIS — Z6823 Body mass index (BMI) 23.0-23.9, adult: Secondary | ICD-10-CM

## 2016-03-16 DIAGNOSIS — D649 Anemia, unspecified: Secondary | ICD-10-CM | POA: Diagnosis not present

## 2016-03-16 DIAGNOSIS — R6510 Systemic inflammatory response syndrome (SIRS) of non-infectious origin without acute organ dysfunction: Secondary | ICD-10-CM | POA: Diagnosis present

## 2016-03-16 DIAGNOSIS — J189 Pneumonia, unspecified organism: Secondary | ICD-10-CM | POA: Diagnosis present

## 2016-03-16 DIAGNOSIS — E43 Unspecified severe protein-calorie malnutrition: Secondary | ICD-10-CM | POA: Insufficient documentation

## 2016-03-16 DIAGNOSIS — E440 Moderate protein-calorie malnutrition: Secondary | ICD-10-CM | POA: Diagnosis present

## 2016-03-16 DIAGNOSIS — I358 Other nonrheumatic aortic valve disorders: Secondary | ICD-10-CM | POA: Diagnosis present

## 2016-03-16 LAB — CBC WITH DIFFERENTIAL/PLATELET
BASOS ABS: 0 10*3/uL (ref 0.0–0.1)
Basophils Relative: 0 %
EOS ABS: 0.2 10*3/uL (ref 0.0–0.7)
Eosinophils Relative: 1 %
HCT: 41.8 % (ref 36.0–46.0)
Hemoglobin: 13.2 g/dL (ref 12.0–15.0)
LYMPHS ABS: 4 10*3/uL (ref 0.7–4.0)
Lymphocytes Relative: 20 %
MCH: 30.3 pg (ref 26.0–34.0)
MCHC: 31.6 g/dL (ref 30.0–36.0)
MCV: 96.1 fL (ref 78.0–100.0)
MONO ABS: 0.8 10*3/uL (ref 0.1–1.0)
Monocytes Relative: 4 %
NEUTROS PCT: 75 %
Neutro Abs: 14.8 10*3/uL — ABNORMAL HIGH (ref 1.7–7.7)
PLATELETS: 265 10*3/uL (ref 150–400)
RBC: 4.35 MIL/uL (ref 3.87–5.11)
RDW: 13.7 % (ref 11.5–15.5)
WBC: 19.8 10*3/uL — AB (ref 4.0–10.5)

## 2016-03-16 LAB — COMPREHENSIVE METABOLIC PANEL
ALT: 33 U/L (ref 14–54)
ANION GAP: 15 (ref 5–15)
AST: 39 U/L (ref 15–41)
Albumin: 3.7 g/dL (ref 3.5–5.0)
Alkaline Phosphatase: 109 U/L (ref 38–126)
BUN: 18 mg/dL (ref 6–20)
CHLORIDE: 106 mmol/L (ref 101–111)
CO2: 19 mmol/L — ABNORMAL LOW (ref 22–32)
Calcium: 9.2 mg/dL (ref 8.9–10.3)
Creatinine, Ser: 0.77 mg/dL (ref 0.44–1.00)
GFR calc Af Amer: >60 mL/min (ref >60)
GFR calc non Af Amer: >60 mL/min (ref >60)
GLUCOSE: 154 mg/dL — AB (ref 65–99)
POTASSIUM: 3.6 mmol/L (ref 3.5–5.1)
Sodium: 140 mmol/L (ref 135–145)
TOTAL PROTEIN: 7.2 g/dL (ref 6.5–8.1)
Total Bilirubin: 0.7 mg/dL (ref 0.3–1.2)

## 2016-03-16 LAB — BLOOD GAS, ARTERIAL
Acid-base deficit: 1.4 mmol/L (ref 0.0–2.0)
Bicarbonate: 23.1 mmol/L (ref 20.0–28.0)
DRAWN BY: 232811
O2 CONTENT: 3 L/min
O2 Saturation: 88 %
PCO2 ART: 39.5 mmHg (ref 32.0–48.0)
Patient temperature: 97.7
pH, Arterial: 7.381 (ref 7.350–7.450)
pO2, Arterial: 57.3 mmHg — ABNORMAL LOW (ref 83.0–108.0)

## 2016-03-16 LAB — PROTIME-INR
INR: 1.07
PROTHROMBIN TIME: 13.9 s (ref 11.4–15.2)

## 2016-03-16 LAB — AMMONIA: AMMONIA: 64 umol/L — AB (ref 9–35)

## 2016-03-16 LAB — LACTIC ACID, PLASMA: Lactic Acid, Venous: 8.5 mmol/L (ref 0.5–1.9)

## 2016-03-16 LAB — CBG MONITORING, ED: Glucose-Capillary: 154 mg/dL — ABNORMAL HIGH (ref 65–99)

## 2016-03-16 LAB — I-STAT CG4 LACTIC ACID, ED: LACTIC ACID, VENOUS: 9.04 mmol/L — AB (ref 0.5–1.9)

## 2016-03-16 MED ORDER — SODIUM CHLORIDE 0.9 % IV BOLUS (SEPSIS)
1000.0000 mL | Freq: Once | INTRAVENOUS | Status: AC
  Administered 2016-03-17: 1000 mL via INTRAVENOUS

## 2016-03-16 MED ORDER — VANCOMYCIN HCL IN DEXTROSE 1-5 GM/200ML-% IV SOLN
1000.0000 mg | Freq: Once | INTRAVENOUS | Status: AC
  Administered 2016-03-17: 1000 mg via INTRAVENOUS
  Filled 2016-03-16: qty 200

## 2016-03-16 MED ORDER — SODIUM CHLORIDE 0.9 % IV SOLN: INTRAVENOUS | Status: DC

## 2016-03-16 MED ORDER — DEXTROSE 5 % IV SOLN
2.0000 g | Freq: Once | INTRAVENOUS | Status: AC
  Administered 2016-03-17: 2 g via INTRAVENOUS
  Filled 2016-03-16: qty 2

## 2016-03-16 NOTE — ED Provider Notes (Signed)
[facility]-EMERGENCY DEPT Provider Note   CSN: [id number] Arrival date & time: 03/16/16  2211     History   Chief Complaint Chief Complaint  Patient presents with  . Altered Mental Status    [facility]     HPI Paige Flores is a 75 y.o. female.  Pt presents to the ED today with altered mental status.  Pt is unable to give any hx.  The pt is normally aphasic and demented, but will respond.  The pt lives at [facility] in the memory unit.  Tonight, she refused her meds, so EMS was called.  Pt has a DNR form, but it was left at the facility.      Past Medical History:  Diagnosis Date  . Aphasia   . Bradycardia   . Dementia   . Depression   . Diabetes mellitus without complication (HCC)   . Hypertension     Patient Active Problem List   Diagnosis Date Noted  . Paroxysmal atrial tachycardia (HCC) 01/03/2016  . SSS (sick sinus syndrome) (HCC) 01/03/2016  . Pacemaker 01/03/2016  . Murmur 01/03/2016  . Hyperlipidemia 01/03/2016    History reviewed. No pertinent surgical history.  OB History    No data available       Home Medications    Prior to Admission medications   Medication Sig Start Date End Date Taking? Authorizing Provider  bisacodyl (DULCOLAX) 5 MG EC tablet Take 5 mg by mouth daily as needed for moderate constipation.   Yes Historical Provider, MD  furosemide (LASIX) 40 MG tablet Take 40 mg by mouth 2 (two) times daily.   Yes Historical Provider, MD  glimepiride (AMARYL) 4 MG tablet Take 8 mg by mouth daily with breakfast.   Yes Historical Provider, MD  ibuprofen (ADVIL,MOTRIN) 200 MG tablet Take 200 mg by mouth every 6 (six) hours as needed for moderate pain.   Yes Historical Provider, MD  lactulose (CHRONULAC) 10 GM/15ML solution Take 20 g by mouth 3 (three) times daily.   Yes Historical Provider, MD  pantoprazole (PROTONIX) 40 MG tablet Take 40 mg by mouth daily.   Yes Historical Provider, MD  propranolol (INDERAL) 10 MG tablet Take 10 mg by  mouth 3 (three) times daily.   Yes Historical Provider, MD  spironolactone (ALDACTONE) 100 MG tablet Take 100 mg by mouth 2 (two) times daily.   Yes Historical Provider, MD    Family History Family History  Problem Relation Age of Onset  . Dementia Mother   . Healthy Father     Social History Social History  Substance Use Topics  . Smoking status: Never Smoker  . Smokeless tobacco: Never Used  . Alcohol use No     Allergies   Review of patient's allergies indicates no known allergies.   Review of Systems Review of Systems  Unable to perform ROS: Mental status change     Physical Exam Updated Vital Signs BP 145/88   Pulse 98   Temp 97.8 F (36.6 C) (Rectal)   Resp 15   SpO2 94%   Physical Exam  Constitutional: She appears well-developed. She appears lethargic. She appears distressed.  HENT:  Head: Normocephalic and atraumatic.  Right Ear: External ear normal.  Left Ear: External ear normal.  Nose: Nose normal.  Mouth/Throat: Mucous membranes are dry.  Eyes: Pupils are equal, round, and reactive to light.  Neck: Normal range of motion. Neck supple.  Cardiovascular: Normal rate, regular rhythm, normal heart sounds and intact distal pulses.  Pulmonary/Chest: Tachypnea noted. She is in respiratory distress. She has rhonchi.  Abdominal: Soft. Bowel sounds are normal.  Musculoskeletal: Normal range of motion.  Neurological: She appears lethargic.  Pt is not following any commands.  She is moving all 4 extremities.  She is nonverbal.  Skin: Skin is warm.  Nursing note and vitals reviewed.    ED Treatments / Results  Labs (all labs ordered are listed, but only abnormal results are displayed) Labs Reviewed  CBC WITH DIFFERENTIAL/PLATELET - Abnormal; Notable for the following:       Result Value   WBC 19.8 (*)    Neutro Abs 14.8 (*)    All other components within normal limits  AMMONIA - Abnormal; Notable for the following:    Ammonia 64 (*)    All other  components within normal limits  COMPREHENSIVE METABOLIC PANEL - Abnormal; Notable for the following:    CO2 19 (*)    Glucose, Bld 154 (*)    All other components within normal limits  BLOOD GAS, ARTERIAL - Abnormal; Notable for the following:    pO2, Arterial 57.3 (*)    All other components within normal limits  LACTIC ACID, PLASMA - Abnormal; Notable for the following:    Lactic Acid, Venous 8.5 (*)    All other components within normal limits  CBG MONITORING, ED - Abnormal; Notable for the following:    Glucose-Capillary 154 (*)    All other components within normal limits  I-STAT CG4 LACTIC ACID, ED - Abnormal; Notable for the following:    Lactic Acid, Venous 9.04 (*)    All other components within normal limits  PROTIME-INR  RAPID URINE DRUG SCREEN, HOSP PERFORMED  URINALYSIS, ROUTINE W REFLEX MICROSCOPIC (NOT AT [facility])  TROPONIN I    EKG  EKG Interpretation None       Radiology Ct Head Wo Contrast  Result Date: 03/16/2016 CLINICAL DATA:  Confusion EXAM: CT HEAD WITHOUT CONTRAST TECHNIQUE: Contiguous axial images were obtained from the base of the skull through the vertex without intravenous contrast. COMPARISON:  03/14/2016 CT FINDINGS: Brain: There is mild superficial and marked central atrophy with chronic periventricular white matter small vessel ischemic disease. No acute intraparenchymal hemorrhage, mass effect or midline shift. No acute large vascular territory infarcts. No abnormal extra-axial fluid collection. The basal cisterns are patent. Vascular: Mild atherosclerosis of the carotid siphons. Skull: No acute fracture bone destruction. Sinuses/Orbits: Air-fluid levels in the left frontal and left greater than right maxillary sinuses as before consistent with acute sinusitis. Small left mastoid effusion sclerosis. Orbits are symmetric in appearance. Ocular globes are intact. 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 No acute intracranial abnormality. Frontal and bilateral maxillary sinusitis with air-fluid levels, findings are unchanged. Small left mastoid effusion also unchanged. Electronically Signed   By: Tollie Eth M.D.   On: 03/16/2016 23:38   Dg Chest Port 1 View  Result Date: 03/16/2016 CLINICAL DATA:  75 year old female with altered mental status and shortness of breath EXAM: PORTABLE CHEST 1 VIEW COMPARISON:  None. FINDINGS: There is shallow inspiration. Patchy area of increased density at the left lung base, likely atelectatic changes versus infiltrate. There is no pleural effusion or pneumothorax. Top-normal cardiac size. The aorta is tortuous. Left pectoral pacemaker device. There is osteopenia with degenerative changes of the spine. No acute fracture. IMPRESSION: Left lung base atelectasis versus infiltrate. Clinical correlation is recommended. Electronically Signed   By: Burtis Junes  Radparvar M.D.   On: 03/16/2016 23:35    Procedures Procedures (including critical care time)  Medications Ordered in ED Medications  sodium chloride 0.9 % bolus 1,000 mL (1,000 mLs Intravenous New Bag/Given 03/17/16 0007)    And  0.9 %  sodium chloride infusion (not administered)  vancomycin (VANCOCIN) IVPB 1000 mg/200 mL premix (not administered)  sodium chloride 0.9 % bolus 1,000 mL (1,000 mLs Intravenous New Bag/Given 03/17/16 0007)    And  sodium chloride 0.9 % bolus 1,000 mL (not administered)  ceFEPIme (MAXIPIME) 2 g in dextrose 5 % 50 mL IVPB (2 g Intravenous New Bag/Given 03/17/16 0016)     Initial Impression / Assessment and Plan / ED Course  I have reviewed the triage vital signs and the nursing notes.  Pertinent labs & imaging results that were available during my care of the patient were reviewed by me and considered in my medical decision making (see chart for details).  Clinical Course   I called [facility] and obtained the phone number to her  daughter.  Her cell number is [phone].  I called, but she did not answer.  I left a message with the daughter.  I also told the NH that we need to get the original DNR form.  Pt's daughter did call back and verified the DNR status and that pt did not want intubation or CPR.  DNR form is now here.  Pt d/w Dr. Katrinka BlazingSmith who requested that I speak with ICU.  I spoke with Dr. Isaiah SergeMannam who said that as long as the family does not want invasive measures, she does not need the ICU.  CRITICAL CARE Performed by: Jacalyn LefevreJulie Emon Lance   Total critical care time: 30 minutes  Critical care time was exclusive of separately billable procedures and treating other patients.  Critical care was necessary to treat or prevent imminent or life-threatening deterioration.  Critical care was time spent personally by me on the following activities: development of treatment plan with patient and/or surrogate as well as nursing, discussions with consultants, evaluation of patient's response to treatment, examination of patient, obtaining history from patient or surrogate, ordering and performing treatments and interventions, ordering and review of laboratory studies, ordering and review of radiographic studies, pulse oximetry and re-evaluation of patient's condition.   Final Clinical Impressions(s) / ED Diagnoses   Final diagnoses:  Sepsis, due to unspecified organism (HCC)  HAP (hospital-acquired pneumonia)  Acute respiratory failure with hypoxia (HCC)  Acute delirium    New Prescriptions New Prescriptions   No medications on file     Jacalyn LefevreJulie Aydden Cumpian, MD 03/17/16 0105

## 2016-03-16 NOTE — ED Triage Notes (Signed)
Per ems, pt baseline has changed in last 24hrs, but at baseline pt non-verbal and alert. Today confused, and makes noises. V/s o arrival 134/84, pulse 86, rr18, 96 spo2 on room air , cbg 119.  12 lead unremarkable. possible UTI, 22 in right hand per ems. Has a DNR.  Was not given her normal night time beds tonight.  Facility is [facility]. No recent falls reported.

## 2016-03-16 NOTE — ED Notes (Signed)
Called RT request for arterial blood gas

## 2016-03-16 NOTE — Progress Notes (Signed)
Arrived to patient's bedside to obtain abg, but pt was not available at this time.  Pt in CT.  RT will return, RN aware.

## 2016-03-16 NOTE — ED Notes (Signed)
Called CT per provider stat Ct request

## 2016-03-16 NOTE — ED Notes (Signed)
Bed: [id number] Expected date:  Expected time:  Means of arrival:  Comments: EMS 75 yo female altered mental status

## 2016-03-17 DIAGNOSIS — R569 Unspecified convulsions: Secondary | ICD-10-CM

## 2016-03-17 DIAGNOSIS — R7989 Other specified abnormal findings of blood chemistry: Secondary | ICD-10-CM

## 2016-03-17 DIAGNOSIS — Z66 Do not resuscitate: Secondary | ICD-10-CM | POA: Diagnosis present

## 2016-03-17 DIAGNOSIS — Y95 Nosocomial condition: Secondary | ICD-10-CM | POA: Diagnosis present

## 2016-03-17 DIAGNOSIS — Z993 Dependence on wheelchair: Secondary | ICD-10-CM | POA: Diagnosis not present

## 2016-03-17 DIAGNOSIS — F0390 Unspecified dementia without behavioral disturbance: Secondary | ICD-10-CM | POA: Diagnosis present

## 2016-03-17 DIAGNOSIS — A419 Sepsis, unspecified organism: Principal | ICD-10-CM

## 2016-03-17 DIAGNOSIS — J9601 Acute respiratory failure with hypoxia: Secondary | ICD-10-CM | POA: Diagnosis present

## 2016-03-17 DIAGNOSIS — G9340 Encephalopathy, unspecified: Secondary | ICD-10-CM | POA: Diagnosis present

## 2016-03-17 DIAGNOSIS — I340 Nonrheumatic mitral (valve) insufficiency: Secondary | ICD-10-CM | POA: Diagnosis not present

## 2016-03-17 DIAGNOSIS — E876 Hypokalemia: Secondary | ICD-10-CM | POA: Diagnosis not present

## 2016-03-17 DIAGNOSIS — E872 Acidosis, unspecified: Secondary | ICD-10-CM | POA: Diagnosis present

## 2016-03-17 DIAGNOSIS — F329 Major depressive disorder, single episode, unspecified: Secondary | ICD-10-CM | POA: Diagnosis present

## 2016-03-17 DIAGNOSIS — R410 Disorientation, unspecified: Secondary | ICD-10-CM | POA: Diagnosis present

## 2016-03-17 DIAGNOSIS — I10 Essential (primary) hypertension: Secondary | ICD-10-CM | POA: Diagnosis present

## 2016-03-17 DIAGNOSIS — R778 Other specified abnormalities of plasma proteins: Secondary | ICD-10-CM | POA: Diagnosis present

## 2016-03-17 DIAGNOSIS — R6510 Systemic inflammatory response syndrome (SIRS) of non-infectious origin without acute organ dysfunction: Secondary | ICD-10-CM | POA: Diagnosis present

## 2016-03-17 DIAGNOSIS — E440 Moderate protein-calorie malnutrition: Secondary | ICD-10-CM | POA: Diagnosis present

## 2016-03-17 DIAGNOSIS — Z950 Presence of cardiac pacemaker: Secondary | ICD-10-CM | POA: Diagnosis not present

## 2016-03-17 DIAGNOSIS — E119 Type 2 diabetes mellitus without complications: Secondary | ICD-10-CM | POA: Diagnosis present

## 2016-03-17 DIAGNOSIS — R748 Abnormal levels of other serum enzymes: Secondary | ICD-10-CM | POA: Diagnosis not present

## 2016-03-17 DIAGNOSIS — J189 Pneumonia, unspecified organism: Secondary | ICD-10-CM | POA: Diagnosis present

## 2016-03-17 DIAGNOSIS — Z79899 Other long term (current) drug therapy: Secondary | ICD-10-CM | POA: Diagnosis not present

## 2016-03-17 DIAGNOSIS — I248 Other forms of acute ischemic heart disease: Secondary | ICD-10-CM | POA: Diagnosis present

## 2016-03-17 DIAGNOSIS — I358 Other nonrheumatic aortic valve disorders: Secondary | ICD-10-CM | POA: Diagnosis present

## 2016-03-17 DIAGNOSIS — D649 Anemia, unspecified: Secondary | ICD-10-CM | POA: Diagnosis not present

## 2016-03-17 DIAGNOSIS — K7290 Hepatic failure, unspecified without coma: Secondary | ICD-10-CM | POA: Diagnosis present

## 2016-03-17 DIAGNOSIS — Z6823 Body mass index (BMI) 23.0-23.9, adult: Secondary | ICD-10-CM | POA: Diagnosis not present

## 2016-03-17 LAB — MRSA PCR SCREENING: MRSA by PCR: NEGATIVE

## 2016-03-17 LAB — COMPREHENSIVE METABOLIC PANEL
ALK PHOS: 81 U/L (ref 38–126)
ALT: 32 U/L (ref 14–54)
ANION GAP: 10 (ref 5–15)
AST: 39 U/L (ref 15–41)
Albumin: 3.2 g/dL — ABNORMAL LOW (ref 3.5–5.0)
BILIRUBIN TOTAL: 0.5 mg/dL (ref 0.3–1.2)
BUN: 14 mg/dL (ref 6–20)
CALCIUM: 8.6 mg/dL — AB (ref 8.9–10.3)
CO2: 22 mmol/L (ref 22–32)
Chloride: 110 mmol/L (ref 101–111)
Creatinine, Ser: 0.64 mg/dL (ref 0.44–1.00)
Glucose, Bld: 133 mg/dL — ABNORMAL HIGH (ref 65–99)
Potassium: 3.4 mmol/L — ABNORMAL LOW (ref 3.5–5.1)
SODIUM: 142 mmol/L (ref 135–145)
TOTAL PROTEIN: 6.3 g/dL — AB (ref 6.5–8.1)

## 2016-03-17 LAB — PROCALCITONIN: Procalcitonin: <0.1 ng/mL

## 2016-03-17 LAB — URINALYSIS, ROUTINE W REFLEX MICROSCOPIC
BILIRUBIN URINE: NEGATIVE
GLUCOSE, UA: NEGATIVE mg/dL
Hgb urine dipstick: NEGATIVE
KETONES UR: NEGATIVE mg/dL
Leukocytes, UA: NEGATIVE
NITRITE: NEGATIVE
PH: 6.5 (ref 5.0–8.0)
PROTEIN: NEGATIVE mg/dL
Specific Gravity, Urine: 1.021 (ref 1.005–1.030)

## 2016-03-17 LAB — RAPID URINE DRUG SCREEN, HOSP PERFORMED
AMPHETAMINES: NOT DETECTED
BARBITURATES: NOT DETECTED
Benzodiazepines: NOT DETECTED
Cocaine: NOT DETECTED
OPIATES: NOT DETECTED
TETRAHYDROCANNABINOL: NOT DETECTED

## 2016-03-17 LAB — PROTIME-INR
INR: 1.04
Prothrombin Time: 13.6 seconds (ref 11.4–15.2)

## 2016-03-17 LAB — RESPIRATORY PANEL BY PCR
ADENOVIRUS-RVPPCR: NOT DETECTED
Bordetella pertussis: NOT DETECTED
CORONAVIRUS HKU1-RVPPCR: NOT DETECTED
CORONAVIRUS NL63-RVPPCR: NOT DETECTED
CORONAVIRUS OC43-RVPPCR: NOT DETECTED
Chlamydophila pneumoniae: NOT DETECTED
Coronavirus 229E: NOT DETECTED
INFLUENZA A-RVPPCR: NOT DETECTED
Influenza B: NOT DETECTED
MYCOPLASMA PNEUMONIAE-RVPPCR: NOT DETECTED
Metapneumovirus: NOT DETECTED
PARAINFLUENZA VIRUS 1-RVPPCR: NOT DETECTED
PARAINFLUENZA VIRUS 3-RVPPCR: NOT DETECTED
PARAINFLUENZA VIRUS 4-RVPPCR: NOT DETECTED
Parainfluenza Virus 2: NOT DETECTED
RHINOVIRUS / ENTEROVIRUS - RVPPCR: DETECTED — AB
Respiratory Syncytial Virus: NOT DETECTED

## 2016-03-17 LAB — I-STAT CG4 LACTIC ACID, ED: LACTIC ACID, VENOUS: 2.29 mmol/L — AB (ref 0.5–1.9)

## 2016-03-17 LAB — GLUCOSE, CAPILLARY
GLUCOSE-CAPILLARY: 168 mg/dL — AB (ref 65–99)
Glucose-Capillary: 102 mg/dL — ABNORMAL HIGH (ref 65–99)
Glucose-Capillary: 111 mg/dL — ABNORMAL HIGH (ref 65–99)
Glucose-Capillary: 113 mg/dL — ABNORMAL HIGH (ref 65–99)
Glucose-Capillary: 125 mg/dL — ABNORMAL HIGH (ref 65–99)
Glucose-Capillary: 94 mg/dL (ref 65–99)

## 2016-03-17 LAB — TROPONIN I
Troponin I: 0.46 ng/mL (ref <0.03)
Troponin I: 0.07 ng/mL (ref <0.03)
Troponin I: 0.19 ng/mL (ref <0.03)
Troponin I: 0.4 ng/mL (ref <0.03)

## 2016-03-17 LAB — AMMONIA: Ammonia: 19 umol/L (ref 9–35)

## 2016-03-17 LAB — CBC
HCT: 38.7 % (ref 36.0–46.0)
HEMOGLOBIN: 12.8 g/dL (ref 12.0–15.0)
MCH: 31 pg (ref 26.0–34.0)
MCHC: 33.1 g/dL (ref 30.0–36.0)
MCV: 93.7 fL (ref 78.0–100.0)
PLATELETS: 208 10*3/uL (ref 150–400)
RBC: 4.13 MIL/uL (ref 3.87–5.11)
RDW: 13.9 % (ref 11.5–15.5)
WBC: 17.8 10*3/uL — AB (ref 4.0–10.5)

## 2016-03-17 LAB — CK: Total CK: 174 U/L (ref 38–234)

## 2016-03-17 LAB — LACTIC ACID, PLASMA
LACTIC ACID, VENOUS: 3.2 mmol/L — AB (ref 0.5–1.9)
Lactic Acid, Venous: 2.1 mmol/L (ref 0.5–1.9)
Lactic Acid, Venous: 2 mmol/L (ref 0.5–1.9)

## 2016-03-17 LAB — APTT: APTT: 22 s — AB (ref 24–36)

## 2016-03-17 LAB — STREP PNEUMONIAE URINARY ANTIGEN: Strep Pneumo Urinary Antigen: NEGATIVE

## 2016-03-17 MED ORDER — VANCOMYCIN HCL IN DEXTROSE 750-5 MG/150ML-% IV SOLN: 750.0000 mg | INTRAVENOUS | Status: DC

## 2016-03-17 MED ORDER — IPRATROPIUM-ALBUTEROL 0.5-2.5 (3) MG/3ML IN SOLN: 3.0000 mL | RESPIRATORY_TRACT | Status: DC | PRN

## 2016-03-17 MED ORDER — INSULIN ASPART 100 UNIT/ML ~~LOC~~ SOLN
0.0000 [IU] | SUBCUTANEOUS | Status: DC
  Administered 2016-03-17: 2 [IU] via SUBCUTANEOUS
  Administered 2016-03-17 – 2016-03-19 (×2): 1 [IU] via SUBCUTANEOUS
  Administered 2016-03-20: 2 [IU] via SUBCUTANEOUS
  Administered 2016-03-20 – 2016-03-23 (×6): 1 [IU] via SUBCUTANEOUS
  Administered 2016-03-23: 2 [IU] via SUBCUTANEOUS
  Administered 2016-03-23: 1 [IU] via SUBCUTANEOUS

## 2016-03-17 MED ORDER — LORAZEPAM 2 MG/ML IJ SOLN: 1.0000 mg | INTRAMUSCULAR | Status: DC | PRN

## 2016-03-17 MED ORDER — ONDANSETRON HCL 4 MG PO TABS: 4.0000 mg | ORAL_TABLET | Freq: Four times a day (QID) | ORAL | Status: DC | PRN

## 2016-03-17 MED ORDER — SODIUM CHLORIDE 0.9 % IV SOLN
INTRAVENOUS | Status: DC
  Administered 2016-03-17: 100 mL/h via INTRAVENOUS
  Administered 2016-03-17: 03:00:00 via INTRAVENOUS
  Administered 2016-03-18 (×2): 100 mL/h via INTRAVENOUS
  Administered 2016-03-19 – 2016-03-21 (×4): via INTRAVENOUS

## 2016-03-17 MED ORDER — ACETAMINOPHEN 650 MG RE SUPP
650.0000 mg | Freq: Four times a day (QID) | RECTAL | Status: DC | PRN
  Administered 2016-03-17: 650 mg via RECTAL
  Filled 2016-03-17: qty 1

## 2016-03-17 MED ORDER — FAMOTIDINE IN NACL 20-0.9 MG/50ML-% IV SOLN
20.0000 mg | Freq: Two times a day (BID) | INTRAVENOUS | Status: DC
  Administered 2016-03-17 (×3): 20 mg via INTRAVENOUS
  Filled 2016-03-17 (×3): qty 50

## 2016-03-17 MED ORDER — ACETAMINOPHEN 325 MG PO TABS: 650.0000 mg | ORAL_TABLET | Freq: Four times a day (QID) | ORAL | Status: DC | PRN

## 2016-03-17 MED ORDER — ALBUTEROL SULFATE (2.5 MG/3ML) 0.083% IN NEBU: 2.5000 mg | INHALATION_SOLUTION | Freq: Four times a day (QID) | RESPIRATORY_TRACT | Status: DC

## 2016-03-17 MED ORDER — DEXTROSE 5 % IV SOLN
2.0000 g | INTRAVENOUS | Status: DC
  Administered 2016-03-17 – 2016-03-20 (×4): 2 g via INTRAVENOUS
  Filled 2016-03-17 (×5): qty 2

## 2016-03-17 MED ORDER — ENOXAPARIN SODIUM 40 MG/0.4ML ~~LOC~~ SOLN
40.0000 mg | SUBCUTANEOUS | Status: DC
  Administered 2016-03-17 – 2016-03-19 (×3): 40 mg via SUBCUTANEOUS
  Filled 2016-03-17 (×3): qty 0.4

## 2016-03-17 MED ORDER — ALBUTEROL SULFATE (2.5 MG/3ML) 0.083% IN NEBU: 2.5000 mg | INHALATION_SOLUTION | RESPIRATORY_TRACT | Status: DC | PRN

## 2016-03-17 MED ORDER — IPRATROPIUM BROMIDE 0.02 % IN SOLN: 0.5000 mg | Freq: Four times a day (QID) | RESPIRATORY_TRACT | Status: DC

## 2016-03-17 MED ORDER — LACTULOSE ENEMA
300.0000 mL | Freq: Three times a day (TID) | ORAL | Status: AC
  Administered 2016-03-17 (×2): 300 mL via RECTAL
  Filled 2016-03-17 (×2): qty 300

## 2016-03-17 MED ORDER — ORAL CARE MOUTH RINSE
15.0000 mL | Freq: Two times a day (BID) | OROMUCOSAL | Status: DC
  Administered 2016-03-17 – 2016-03-23 (×11): 15 mL via OROMUCOSAL

## 2016-03-17 MED ORDER — IPRATROPIUM-ALBUTEROL 0.5-2.5 (3) MG/3ML IN SOLN
3.0000 mL | Freq: Three times a day (TID) | RESPIRATORY_TRACT | Status: DC
  Filled 2016-03-17: qty 3

## 2016-03-17 MED ORDER — ONDANSETRON HCL 4 MG/2ML IJ SOLN: 4.0000 mg | Freq: Four times a day (QID) | INTRAMUSCULAR | Status: DC | PRN

## 2016-03-17 MED ORDER — SODIUM CHLORIDE 0.9 % IV BOLUS (SEPSIS)
500.0000 mL | Freq: Once | INTRAVENOUS | Status: AC
  Administered 2016-03-17: 500 mL via INTRAVENOUS

## 2016-03-17 MED ORDER — LACTULOSE ENEMA: 300.0000 mL | Freq: Three times a day (TID) | ORAL | Status: DC

## 2016-03-17 NOTE — Care Management Note (Signed)
Case Management Note  Patient Details  Name: Paige Flores MRN: [id number] Date of Birth: 07/13/1940  Subjective/Objective:          Pneumonia versus sepsis          Action/Plan: From [facility] will return on discharge Expected Discharge Date:                  Expected Discharge Plan:  Skilled Nursing Facility  In-House Referral:  Clinical Social Work  Discharge planning Services     Post Acute Care Choice:    Choice offered to:     DME Arranged:    DME Agency:     HH Arranged:    HH Agency:     Status of Service:  In process, will continue to follow  If discussed at Long Length of Stay Meetings, dates discussed:    Additional Comments: Date:  March 17, 2016 Chart reviewed for concurrent status and case management needs. Will continue to follow the patient for status change: Discharge Planning: following for needs Expected discharge date: [id number] Paige SmilingRhonda Flores, BSN, [place], [place]   [phone] Paige Flores, Paige Lynn, RN 03/17/2016, 11:46 AM

## 2016-03-17 NOTE — Progress Notes (Signed)
Pharmacy Antibiotic Note  Paige AreolaDaisy Flores is a 75 y.o. female admitted on 03/16/2016 with pneumonia and sepsis.  Pharmacy has been consulted for Vancomycin, cefepime  dosing.  Plan: Vancomycin 750mg  IV every 24 hours.  Goal trough 15-20 mcg/mL.  Cefepime 2gm iv q24hr  Height: 4\' 10"  (147.3 cm) Weight: 104 lb 8 oz (47.4 kg) IBW/kg (Calculated) : 40.9  Temp (24hrs), Avg:97.4 F (36.3 C), Min:97 F (36.1 C), Max:97.8 F (36.6 C)   Recent Labs Lab 03/16/16 2301 03/16/16 2310 03/16/16 2315 03/17/16 0135 03/17/16 0153  WBC 19.8*  --   --   --   --   CREATININE 0.77  --   --   --   --   LATICACIDVEN  --  9.04* 8.5* 2.1* 2.29*    Estimated Creatinine Clearance: 38 mL/min (by C-G formula based on SCr of 0.77 mg/dL).    No Known Allergies  Antimicrobials this admission: Vancomycin 03/17/2016 >> Cefepime 03/17/2016 >>   Dose adjustments this admission: -  Microbiology results: pending  Thank you for allowing pharmacy to be a part of this patient's care.  Paige DavidsonGrimsley Flores, Paige Flores 03/17/2016 4:04 AM

## 2016-03-17 NOTE — Progress Notes (Addendum)
Initial Nutrition Assessment  DOCUMENTATION CODES:   Non-severe (moderate) malnutrition in context of chronic illness  INTERVENTION:  - Diet advancement if medically feasible. - If unable to advance diet and if TF within POC/GOC, recommend Osmolite 1.2 @ 45 mL/hr to provide 1296 kcal, 60 grams of protein, and 886 mL free water.  - RD will follow-up 10/27.  NUTRITION DIAGNOSIS:   Inadequate oral intake related to inability to eat as evidenced by NPO status.   GOAL:   Patient will meet greater than or equal to 90% of their needs  MONITOR:   Diet advancement, Weight trends, Labs, I & O's  REASON FOR ASSESSMENT:   Low Braden  ASSESSMENT:   75 y.o.  female with medical history significant of HTN, diabetes type 2, and dementia; who presents after being found by staff acutely altered. History is obtained by one of the patient's nursing home caregivers and daughter who is present at bedside as the patient has dementia and is acutely altered. Patient lives in [facility] and at baseline is nonverbal and will intermittently smile. Caregiver notes that she was not taking any of her medications today and was found acutely foaming at the mouth with rigid posture with hands curled up together as though she were having a seizure. No previous history of seizure or requiring oxygen to maintain O2 sats. She is noted to intermittently grunt and intermittently jerk. Patient has had the intermittent jerking for over a year but it had recently gotten worse in the last few months. She had been recently started on gabapentin. Patient had last fallen out of bed thought to be related to these jerks and had come to the ED multiple times for falls last noted to come on 10/21. At that time family noted that there was no signs of infection or issues from lab work done.   Pt seen for low Braden. BMI indicates normal weight status. Pt has been NPO since admission and unable to meet estimated nutrition needs. Per  rounds, pt is nonverbal at baseline; no family/visitors present to provide PTA information. Also per rounds, pt with 2 recent admissions for falls. RN note from today at 1053 reviewed.   Physical assessment shows mild muscle and fat wasting to upper body and mild to moderate muscle wasting to lower body. No edema present at this time. Only other weight available at this time is from 02/01/16 and shows weight of 130 lbs; question accuracy of this based on information available in notes from PTA while at [facility]. Will continue to monitor weight trends during admission.   Medications reviewed; 20 mg IV Pepcid BID, sliding scale Novolog, 300 mL lactulose TID, PRN Zofran. Labs reviewed; CBGs: 102 and 168 mg/dL this AM, K: 3.4 mmol/L, Ca: 8.6 mg/dL, ammonia now WDL. IVF: NS @ 100 mL/hr.   ADDENDUM: Daughter reports that pt was not eating for unknown period of time PTA as she was not awake/alert enough to do so; she was informed of this by facility staff PTA. Daughter then requested that pt be provided with protein shakes as she felt that pt appeared malnourished.     Diet Order:  Diet NPO time specified  Skin:  Reviewed, no issues  Last BM:  10/24 (smear)  Height:   Ht Readings from Last 1 Encounters:  03/17/16 4\' 10"  (1.473 m)    Weight:   Wt Readings from Last 1 Encounters:  03/17/16 104 lb 8 oz (47.4 kg)    Ideal Body Weight:  42.18 kg  BMI:  Body mass index is 21.84 kg/m.  Estimated Nutritional Needs:   Kcal:  1185-1327 (20-23 kcal/kg)  Protein:  47-57 grams (1-1.2 grams/kg)  Fluid:  >/= 1.2 L/day  EDUCATION NEEDS:   No education needs identified at this time    Trenton GammonJessica Danaisha Celli, MS, RD, LDN Inpatient Clinical Dietitian Pager # [phone] After hours/weekend pager # [phone]

## 2016-03-17 NOTE — H&P (Addendum)
History and Physical    Paige Flores WUJ:[id number] DOB: 25-Apr-1941 DOA: 03/16/2016  Referring MD/NP/PA: Dr. Particia Nearing PCP: Ron Parker, MD  Patient coming from: [facility]  Chief Complaint: Altered mental status  HPI: Paige Flores is a 75 y.o.  female with medical history significant of HTN, diabetes type 2, and dementia; who presents after being found by staff acutely altered. History is obtained by one of the patient's nursing home caregivers and daughter who is present at bedside as the patient has dementia and is acutely altered. Patient lives in [facility] and at baseline is nonverbal and will intermittently smile. Caregiver notes that she was not taking any of her medications today and was found acutely foaming at the mouth with rigid posture with hands curled up together as though she were having a seizure. No previous history of seizure or requiring oxygen to maintain O2 sats. She is noted to intermittently grunt and intermittently jerk. Patient has had the intermittent jerking for over a year but it had recently gotten worse in the last few months. She had been recently started on gabapentin. Patient had last fallen out of bed thought to be related to these jerks and had come to the ED multiple times for falls last noted to come on 10/21. At that time family noted that there was no signs of infection or issues from lab work done. Patient had not been known to previously been sick and they give no note of any other warning signs prior to tonight's admission.   ED course: Upon admission into the emergency department patient was seen to be hypoxic with O2 sats as low as 74% on 3 L nasal cannula oxygen for which she was switched to a Ventimask.  Systolic blood pressures seen as low as 80s, heart rates up to 107, respirations up to 24, and temperature 97.33F. Lab work revealed  WBC 19.8, ammonia 63, troponin elevated 0.46, lactic acid 9.04 (repeat a 8.5).UDS was negative. CT scan of  the head showed no acute changes. Chest x-ray showed left lung atelectasis versus infiltrate. Sepsis protocol initiated and patient was started on empiric antibiotics of vancomycin and cefepime. Patient's daughter was notified and states that the patient is a DO NOT RESUSCITATE.   Review of Systems:  Unable to obtain as patient is acutely altered and has a history of dementia.   Past Medical History:  Diagnosis Date  . Aphasia   . Bradycardia   . Dementia   . Depression   . Diabetes mellitus without complication (HCC)   . Hypertension     History reviewed. No pertinent surgical history.   reports that she has never smoked. She has never used smokeless tobacco. She reports that she does not drink alcohol or use drugs.  No Known Allergies  Family History  Problem Relation Age of Onset  . Dementia Mother   . Healthy Father     Prior to Admission medications   Medication Sig Start Date End Date Taking? Authorizing Provider  bisacodyl (DULCOLAX) 5 MG EC tablet Take 5 mg by mouth daily as needed for moderate constipation.   Yes Historical Provider, MD  furosemide (LASIX) 40 MG tablet Take 40 mg by mouth 2 (two) times daily.   Yes Historical Provider, MD  glimepiride (AMARYL) 4 MG tablet Take 8 mg by mouth daily with breakfast.   Yes Historical Provider, MD  ibuprofen (ADVIL,MOTRIN) 200 MG tablet Take 200 mg by mouth every 6 (six) hours as needed for moderate pain.  Yes Historical Provider, MD  lactulose (CHRONULAC) 10 GM/15ML solution Take 20 g by mouth 3 (three) times daily.   Yes Historical Provider, MD  pantoprazole (PROTONIX) 40 MG tablet Take 40 mg by mouth daily.   Yes Historical Provider, MD  propranolol (INDERAL) 10 MG tablet Take 10 mg by mouth 3 (three) times daily.   Yes Historical Provider, MD  spironolactone (ALDACTONE) 100 MG tablet Take 100 mg by mouth 2 (two) times daily.   Yes Historical Provider, MD    Physical Exam:   Constitutional: Acutely ill appearing  elderly woman who is nonverbal and not responding to verbal commands Vitals:   03/16/16 2227 03/16/16 2230 03/16/16 2330 03/17/16 0021  BP: 127/77 145/88    Pulse: 107 98    Resp: 24 20 15    Temp: 97.8 F (36.6 C)     TempSrc: Rectal     SpO2: (!) 74%   94%   Eyes: PERRL, lids and conjunctivae normal ENMT: Mucous membranes are dry. Normal dentition  Neck: normal, supple, no masses, no thyromegaly Respiratory: Decreased overall air movement with lung sounds significantly decreased on the left lower lung base. No rhonchi or wheezes appreciated Cardiovascular: Tachycardic, no murmurs / rubs / gallops. No extremity edema. 2+ pedal pulses. No carotid bruits.  Abdomen: no tenderness, no masses palpated. No hepatosplenomegaly. Bowel sounds positive.  Musculoskeletal: no clubbing / cyanosis. No joint deformity upper and lower extremities. Good ROM, no contractures. Normal muscle tone.  Skin: no rashes, lesions, ulcers. No induration Neurologic: Patient I'm purposely moving all extremities Psychiatric: Altered not following commands at this time    Labs on Admission: I have personally reviewed following labs and imaging studies  CBC:  Recent Labs Lab 03/16/16 2301  WBC 19.8*  NEUTROABS 14.8*  HGB 13.2  HCT 41.8  MCV 96.1  PLT 265   Basic Metabolic Panel:  Recent Labs Lab 03/16/16 2301  NA 140  K 3.6  CL 106  CO2 19*  GLUCOSE 154*  BUN 18  CREATININE 0.77  CALCIUM 9.2   GFR: CrCl cannot be calculated (Unknown ideal weight.). Liver Function Tests:  Recent Labs Lab 03/16/16 2301  AST 39  ALT 33  ALKPHOS 109  BILITOT 0.7  PROT 7.2  ALBUMIN 3.7   No results for input(s): LIPASE, AMYLASE in the last 168 hours.  Recent Labs Lab 03/16/16 2256  AMMONIA 64*   Coagulation Profile:  Recent Labs Lab 03/16/16 2301  INR 1.07   Cardiac Enzymes: No results for input(s): CKTOTAL, CKMB, CKMBINDEX, TROPONINI in the last 168 hours. BNP (last 3 results) No results  for input(s): PROBNP in the last 8760 hours. HbA1C: No results for input(s): HGBA1C in the last 72 hours. CBG:  Recent Labs Lab 03/16/16 2238  GLUCAP 154*   Lipid Profile: No results for input(s): CHOL, HDL, LDLCALC, TRIG, CHOLHDL, LDLDIRECT in the last 72 hours. Thyroid Function Tests: No results for input(s): TSH, T4TOTAL, FREET4, T3FREE, THYROIDAB in the last 72 hours. Anemia Panel: No results for input(s): VITAMINB12, FOLATE, FERRITIN, TIBC, IRON, RETICCTPCT in the last 72 hours. Urine analysis: No results found for: COLORURINE, APPEARANCEUR, LABSPEC, PHURINE, GLUCOSEU, HGBUR, BILIRUBINUR, KETONESUR, PROTEINUR, UROBILINOGEN, NITRITE, LEUKOCYTESUR Sepsis Labs: No results found for this or any previous visit (from the past 240 hour(s)).   Radiological Exams on Admission: Ct Head Wo Contrast  Result Date: 03/16/2016 CLINICAL DATA:  Confusion EXAM: CT HEAD WITHOUT CONTRAST TECHNIQUE: Contiguous axial images were obtained from the base of the skull through the  vertex without intravenous contrast. COMPARISON:  03/14/2016 CT FINDINGS: Brain: There is mild superficial and marked central atrophy with chronic periventricular white matter small vessel ischemic disease. No acute intraparenchymal hemorrhage, mass effect or midline shift. No acute large vascular territory infarcts. No abnormal extra-axial fluid collection. The basal cisterns are patent. Vascular: Mild atherosclerosis of the carotid siphons. Skull: No acute fracture bone destruction. Sinuses/Orbits: Air-fluid levels in the left frontal and left greater than right maxillary sinuses as before consistent with acute sinusitis. Small left mastoid effusion sclerosis. Orbits are symmetric in appearance. Ocular globes are intact. Other: None IMPRESSION: Marked central atrophy with moderate chronic small vessel ischemic disease of periventricular white matter. No acute intracranial abnormality. Frontal and bilateral maxillary sinusitis with  air-fluid levels, findings are unchanged. Small left mastoid effusion also unchanged. Electronically Signed   By: Tollie Ethavid  Kwon M.D.   On: 03/16/2016 23:38   Dg Chest Port 1 View  Result Date: 03/16/2016 CLINICAL DATA:  75 year old female with altered mental status and shortness of breath EXAM: PORTABLE CHEST 1 VIEW COMPARISON:  None. FINDINGS: There is shallow inspiration. Patchy area of increased density at the left lung base, likely atelectatic changes versus infiltrate. There is no pleural effusion or pneumothorax. Top-normal cardiac size. The aorta is tortuous. Left pectoral pacemaker device. There is osteopenia with degenerative changes of the spine. No acute fracture. IMPRESSION: Left lung base atelectasis versus infiltrate. Clinical correlation is recommended. Electronically Signed   By: Elgie CollardArash  Radparvar M.D.   On: 03/16/2016 23:35    EKG: Independently reviewed.   Assessment/Plan Sepsis secondary to suspected HCAP: Acute. Patient with hypoxia found to have left lower lung atelectasis versus infiltrate, tachycardia, tachypnea, elevated WBC, and lactic acid of 9.05. - Admit to stepdown - Sepsis protocol initiated - Follow-up blood and sputum cultures - Empiric antibiotics of cefepime and vancomycin per pharmacy  Acute respiratory failure with hypoxia: Acute initial ABG showed pH of 7.381, PO2 57.3, CO2 39.5. - Continuous pulse oximetry - Oxygen as needed, and will consider BiPAP if necessary  Lactic acidosis: Lactic acid elevated at 9.04 on admission trending down to 80.5  on recheck.sepsis - Trend lactic acid levels  Elevated troponin: Acute. Initial troponin 0.46 on admission - Trend troponins  Possible seizure-like activity: Acute. Patient noted to be foaming at the mouth prior to arrival with rigid posturing. Question if this causes patient's acute presentation. - Seizure precautions - neuro checks - check CPK - Monitoring for seizure-like activity as there was some question  to this prior to arrival - Ativan prn seizure activity - Consider either further workup including EEG and CT scan if clinical status improved.  Suspected Hepatic encephalopathy/ elevated ammonia level: Acute. Patient with elevated ammonia level of 63 on admission. Patient appears to have been previously on lactulose at home, but was not on her most current medication list. -  NPO - Place rectal tube  - Lactulose enemas for now  Status post pacemaker - Continue to monitor  Diabetes mellitus type 2 - Hypoglycemic protocols - Held Amaryl - CBGs every 4 hours for now  GI prophylaxis  - Pepcid IV   DVT prophylaxis: Lovenox Code Status: DO NOT RESUSCITATE  Family Communication: Discussed care with Daughter and husband along present at bedside Disposition Plan: TBD Consults called: None Admission status: Inpatient stepdown   Clydie Braunondell A Haeley Fordham MD [facility] Pager [phone]  If 7PM-7AM, please contact night-coverage [email or web address] Password [email or web address]  03/17/2016, 12:48 AM

## 2016-03-17 NOTE — Progress Notes (Signed)
CRITICAL VALUE ALERT  Critical value received:  LA 3.2, Troponin 0.07  Date of notification:  03/17/16  Time of notification:  0640, 0650  Critical value read back:Yes.    Nurse who received alert:  Rutha BouchardShelby Jaedyn Marrufo, RN  MD notified (1st page):  Merdis DelayK. Schorr, NP  Time of first page:  [phone], [phone]  MD notified (2nd page):  Time of second page:  Responding MD:  Merdis DelayK. Schorr, NP  Time MD responded:  [phone]

## 2016-03-17 NOTE — Progress Notes (Addendum)
Patient seen and examined, she is improving, now able to wean to nasal cannula 4liters, respiratory viral panel +  Rhinovirus.   mrsa screen negative, d/c vanc, continue cefepime, need swallow eval when patient is more awake.  ( daughter report her mother does not have problem swallowing, at baseline , patient is able to feed herself but wheelchair bound, not able to carry meaningful conversation)  I have talked to daughter at bedside, updated her about the test results, I advised the daughter that patient's deterioration could be combination of progressive dementia, acute illness. Daughter thinks her mother is oversedated on neurontin which could contribute to patient's recent decline as well  I told daughter that after treating acute illness, if patient does not improve, then more discussion about goal of care will be needed, she expressed understanding.  Addendum: labs showed troponin elevation , patient has advanced dementia, will not be a candidate for aggressive intervention, will get echocardiogram, continue cycle troponin, troponin elevated likely demand ischemia in the setting of acute illness.  EKG ordered for interval changes, doubt patient will be cooperative for testing. Will continue to monitor.

## 2016-03-17 NOTE — Progress Notes (Signed)
Per MD request, this RN called Paige Flores to assess pt's baseline. [facility] states that pt is typically nonverbal and does not follow commands. She will occasionally mumble but speech is incomprehensible. RN notes that pt is primarily Spanish speaking. He also noted that pt has poor muscle tone and frequent tremors and will occasionally "lean her head forward and tip out of her wheelchair."   Sinclair GroomsNivi Sheenah Dimitroff, RN

## 2016-03-17 NOTE — Progress Notes (Signed)
Pt in fetal position, RN unable to apply SCDs at this time. Will continue to monitor.

## 2016-03-17 NOTE — Progress Notes (Signed)
Urine sample sent down for pt while pt was in the ED. RN called down to lab to ask if previous urine sample could be used to collect additional urine labs that are order. Lab technician to get back to RN. Will continue to monitor.

## 2016-03-17 NOTE — Progress Notes (Signed)
CRITICAL VALUE ALERT  Critical value received:  Lactic acid 2.0    Date of notification:  03/17/16  Time of notification:  0920  Critical value read back:Yes.    Nurse who received alert:  Gae DryJessica Pearson Reasons RN  MD notified (1st page):  Roda ShuttersXu  Time of first page:  MD on unit- notified in person 0920  MD notified (2nd page):  Time of second page:  Responding MD:  Roda ShuttersXu  Time MD responded:  [phone]

## 2016-03-18 ENCOUNTER — Other Ambulatory Visit (HOSPITAL_COMMUNITY): Payer: Medicare Other

## 2016-03-18 DIAGNOSIS — E872 Acidosis: Secondary | ICD-10-CM

## 2016-03-18 DIAGNOSIS — J189 Pneumonia, unspecified organism: Secondary | ICD-10-CM

## 2016-03-18 DIAGNOSIS — R748 Abnormal levels of other serum enzymes: Secondary | ICD-10-CM

## 2016-03-18 DIAGNOSIS — E43 Unspecified severe protein-calorie malnutrition: Secondary | ICD-10-CM | POA: Insufficient documentation

## 2016-03-18 DIAGNOSIS — J9601 Acute respiratory failure with hypoxia: Secondary | ICD-10-CM

## 2016-03-18 DIAGNOSIS — G9340 Encephalopathy, unspecified: Secondary | ICD-10-CM

## 2016-03-18 LAB — BASIC METABOLIC PANEL
Anion gap: 9 (ref 5–15)
BUN: 8 mg/dL (ref 6–20)
CALCIUM: 8.7 mg/dL — AB (ref 8.9–10.3)
CHLORIDE: 114 mmol/L — AB (ref 101–111)
CO2: 19 mmol/L — AB (ref 22–32)
Creatinine, Ser: 0.61 mg/dL (ref 0.44–1.00)
GFR calc non Af Amer: >60 mL/min (ref >60)
Glucose, Bld: 124 mg/dL — ABNORMAL HIGH (ref 65–99)
Potassium: 3.3 mmol/L — ABNORMAL LOW (ref 3.5–5.1)
Sodium: 142 mmol/L (ref 135–145)

## 2016-03-18 LAB — GLUCOSE, CAPILLARY
GLUCOSE-CAPILLARY: 121 mg/dL — AB (ref 65–99)
GLUCOSE-CAPILLARY: 97 mg/dL (ref 65–99)
Glucose-Capillary: 100 mg/dL — ABNORMAL HIGH (ref 65–99)
Glucose-Capillary: 100 mg/dL — ABNORMAL HIGH (ref 65–99)
Glucose-Capillary: 96 mg/dL (ref 65–99)
Glucose-Capillary: 106 mg/dL — ABNORMAL HIGH (ref 65–99)

## 2016-03-18 LAB — LACTIC ACID, PLASMA: LACTIC ACID, VENOUS: 1.7 mmol/L (ref 0.5–1.9)

## 2016-03-18 LAB — AMMONIA: AMMONIA: 40 umol/L — AB (ref 9–35)

## 2016-03-18 LAB — LEGIONELLA PNEUMOPHILA SEROGP 1 UR AG: L. PNEUMOPHILA SEROGP 1 UR AG: NEGATIVE

## 2016-03-18 LAB — MAGNESIUM: Magnesium: 1.8 mg/dL (ref 1.7–2.4)

## 2016-03-18 LAB — TSH: TSH: 1.321 u[IU]/mL (ref 0.350–4.500)

## 2016-03-18 MED ORDER — FAMOTIDINE IN NACL 20-0.9 MG/50ML-% IV SOLN
20.0000 mg | INTRAVENOUS | Status: DC
  Administered 2016-03-18 – 2016-03-20 (×3): 20 mg via INTRAVENOUS
  Filled 2016-03-18 (×3): qty 50

## 2016-03-18 NOTE — Progress Notes (Signed)
PROGRESS NOTE    Paige Flores  ZOX:[id number] DOB: Dec 19, 1940 DOA: 03/16/2016 PCP: Ron Parker, MD    Brief Narrative: Paige Flores is a 75 y.o.  female with medical history significant of HTN, diabetes type 2, and dementia presents with acute encephalopathy.   She was found to have sepsis from  health care associated pneumonia,.    Assessment & Plan:   Active Problems:   Pacemaker   Sepsis (HCC)   HCAP (healthcare-associated pneumonia)   Lactic acidosis   Acute respiratory failure with hypoxia (HCC)   Elevated troponin   Seizure-like activity (HCC)   Acute encephalopathy   Malnutrition of moderate degree   Sepsis from health care associated pneumonia:  Improving parameters, her hypoxia has improved, she is currently on 2 to 3 liters of  oxygen. Respiratory panel is positive for rhino virus.  Started on broad spectrum antibiotics.  SLP evaluation pending.  Monitor wbc count.  Lactic acid normalized.    Acute respiratory failure with hypoxia: From HEALTH CAP.   Malnutrition : moderate, get SLP eval.    Acute encephalopathy: - possibly infectious vs from medications . - she is more alert but confused,  - she appears to be at baseline.   Elevated troponins: Demand ischemia from sepsis.    DVT prophylaxis: (Lovenox/ Code Status: DNR.  Family Communication:  None at bedside.  Disposition Plan: pending, further eval.    Consultants:   None    Procedures: none.    Antimicrobials: cefepime 10/25   Subjective: Appear to be comfortable.    Objective: Vitals:   03/18/16 0411 03/18/16 0600 03/18/16 0700 03/18/16 0800  BP:  (!) 145/95 118/85 137/88  Pulse:  95 95 96  Resp:  17 (!) 24 19  Temp: 99 F (37.2 C)   98.5 F (36.9 C)  TempSrc: Axillary   Oral  SpO2:  100% 96% 97%  Weight: 50.7 kg (111 lb 12.4 oz)     Height:        Intake/Output Summary (Last 24 hours) at 03/18/16 0913 Last data filed at 03/18/16 0400  Gross per 24 hour    Intake             1950 ml  Output              200 ml  Net             1750 ml   Filed Weights   03/17/16 0310 03/18/16 0411  Weight: 47.4 kg (104 lb 8 oz) 50.7 kg (111 lb 12.4 oz)    Examination:  General exam: Appears calm and comfortable  Respiratory system: Clear to auscultation. Respiratory effort normal. Cardiovascular system: S1 & S2 heard, RRR. No JVD, murmurs, rubs, gallops or clicks. Pedal edema.  Gastrointestinal system: Abdomen is nondistended, soft and nontender. No organomegaly or masses felt. Normal bowel sounds heard. Central nervous system: alert but confused.  Extremities: Symmetric 5 x 5 power. Skin: No rashes, lesions or ulcers     Data Reviewed: I have personally reviewed following labs and imaging studies  CBC:  Recent Labs Lab 03/16/16 2301 03/17/16 0555  WBC 19.8* 17.8*  NEUTROABS 14.8*  --   HGB 13.2 12.8  HCT 41.8 38.7  MCV 96.1 93.7  PLT 265 208   Basic Metabolic Panel:  Recent Labs Lab 03/16/16 2301 03/17/16 0555 03/18/16 0314  NA 140 142 142  K 3.6 3.4* 3.3*  CL 106 110 114*  CO2 19* 22 19*  GLUCOSE 154* 133* 124*  BUN 18 14 8   CREATININE 0.77 0.64 0.61  CALCIUM 9.2 8.6* 8.7*  MG  --   --  1.8   GFR: Estimated Creatinine Clearance: 41.7 mL/min (by C-G formula based on SCr of 0.61 mg/dL). Liver Function Tests:  Recent Labs Lab 03/16/16 2301 03/17/16 0555  AST 39 39  ALT 33 32  ALKPHOS 109 81  BILITOT 0.7 0.5  PROT 7.2 6.3*  ALBUMIN 3.7 3.2*   No results for input(s): LIPASE, AMYLASE in the last 168 hours.  Recent Labs Lab 03/16/16 2256 03/17/16 0838 03/18/16 0314  AMMONIA 64* 19 40*   Coagulation Profile:  Recent Labs Lab 03/16/16 2301 03/17/16 0144  INR 1.07 1.04   Cardiac Enzymes:  Recent Labs Lab 03/16/16 2301 03/17/16 0135 03/17/16 0555 03/17/16 1159 03/17/16 1821  CKTOTAL  --  174  --   --   --   TROPONINI 0.46*  --  0.07* 0.19* 0.40*   BNP (last 3 results) No results for input(s):  PROBNP in the last 8760 hours. HbA1C: No results for input(s): HGBA1C in the last 72 hours. CBG:  Recent Labs Lab 03/17/16 1637 03/17/16 2049 03/17/16 2342 03/18/16 0338 03/18/16 0833  GLUCAP 111* 125* 94 121* 100*   Lipid Profile: No results for input(s): CHOL, HDL, LDLCALC, TRIG, CHOLHDL, LDLDIRECT in the last 72 hours. Thyroid Function Tests:  Recent Labs  03/18/16 0314  TSH 1.321   Anemia Panel: No results for input(s): VITAMINB12, FOLATE, FERRITIN, TIBC, IRON, RETICCTPCT in the last 72 hours. Sepsis Labs:  Recent Labs Lab 03/17/16 0135 03/17/16 0144 03/17/16 0153 03/17/16 0555 03/17/16 0838  PROCALCITON  --  <0.10  --   --   --   LATICACIDVEN 2.1*  --  2.29* 3.2* 2.0*    Recent Results (from the past 240 hour(s))  Culture, blood (x 2)     Status: None (Preliminary result)   Collection Time: 03/16/16 11:00 PM  Result Value Ref Range Status   Specimen Description BLOOD LEFT ANTECUBITAL  Final   Special Requests BOTTLES DRAWN AEROBIC AND ANAEROBIC 5CC EACH  Final   Culture   Final    NO GROWTH < 12 HOURS [facility]    Report Status PENDING  Incomplete  Culture, blood (x 2)     Status: None (Preliminary result)   Collection Time: 03/17/16  1:35 AM  Result Value Ref Range Status   Specimen Description BLOOD RIGHT ANTECUBITAL  Final   Special Requests BOTTLES DRAWN AEROBIC AND ANAEROBIC 5CC EACH  Final   Culture   Final    NO GROWTH < 12 HOURS [facility]formed at [facility]    Report Status PENDING  Incomplete  Respiratory Panel by PCR     Status: Abnormal   Collection Time: 03/17/16  3:46 AM  Result Value Ref Range Status   Adenovirus NOT DETECTED NOT DETECTED Final   Coronavirus 229E NOT DETECTED NOT DETECTED Final   Coronavirus HKU1 NOT DETECTED NOT DETECTED Final   Coronavirus NL63 NOT DETECTED NOT DETECTED Final   Coronavirus OC43 NOT DETECTED NOT DETECTED Final   Metapneumovirus NOT DETECTED NOT DETECTED Final    Rhinovirus / Enterovirus DETECTED (A) NOT DETECTED Final   Influenza A NOT DETECTED NOT DETECTED Final   Influenza B NOT DETECTED NOT DETECTED Final   Parainfluenza Virus 1 NOT DETECTED NOT DETECTED Final   Parainfluenza Virus 2 NOT DETECTED NOT DETECTED Final   Parainfluenza Virus 3 NOT DETECTED NOT DETECTED Final  Parainfluenza Virus 4 NOT DETECTED NOT DETECTED Final   Respiratory Syncytial Virus NOT DETECTED NOT DETECTED Final   Bordetella pertussis NOT DETECTED NOT DETECTED Final   Chlamydophila pneumoniae NOT DETECTED NOT DETECTED Final   Mycoplasma pneumoniae NOT DETECTED NOT DETECTED Final    Comment: Performed at [facility]  MRSA PCR Screening     Status: None   Collection Time: 03/17/16  3:47 AM  Result Value Ref Range Status   MRSA by PCR NEGATIVE NEGATIVE Final    Comment:        The GeneXpert MRSA Assay (FDA approved for NASAL specimens only), is one component of a comprehensive MRSA colonization surveillance program. It is not intended to diagnose MRSA infection nor to guide or monitor treatment for MRSA infections.          Radiology Studies: Ct Head Wo Contrast  Result Date: 03/16/2016 CLINICAL DATA:  Confusion EXAM: CT HEAD WITHOUT CONTRAST TECHNIQUE: Contiguous axial images were obtained from the base of the skull through the vertex without intravenous contrast. COMPARISON:  03/14/2016 CT FINDINGS: Brain: There is mild superficial and marked central atrophy with chronic periventricular white matter small vessel ischemic disease. No acute intraparenchymal hemorrhage, mass effect or midline shift. No acute large vascular territory infarcts. No abnormal extra-axial fluid collection. The basal cisterns are patent. Vascular: Mild atherosclerosis of the carotid siphons. Skull: No acute fracture bone destruction. Sinuses/Orbits: Air-fluid levels in the left frontal and left greater than right maxillary sinuses as before consistent with acute sinusitis. Small  left mastoid effusion sclerosis. Orbits are symmetric in appearance. Ocular globes are intact. Other: None IMPRESSION: Marked central atrophy with moderate chronic small vessel ischemic disease of periventricular white matter. No acute intracranial abnormality. Frontal and bilateral maxillary sinusitis with air-fluid levels, findings are unchanged. Small left mastoid effusion also unchanged. Electronically Signed   By: Tollie Eth M.D.   On: 03/16/2016 23:38   Dg Chest Port 1 View  Result Date: 03/16/2016 CLINICAL DATA:  75 year old female with altered mental status and shortness of breath EXAM: PORTABLE CHEST 1 VIEW COMPARISON:  None. FINDINGS: There is shallow inspiration. Patchy area of increased density at the left lung base, likely atelectatic changes versus infiltrate. There is no pleural effusion or pneumothorax. Top-normal cardiac size. The aorta is tortuous. Left pectoral pacemaker device. There is osteopenia with degenerative changes of the spine. No acute fracture. IMPRESSION: Left lung base atelectasis versus infiltrate. Clinical correlation is recommended. Electronically Signed   By: Elgie Collard M.D.   On: 03/16/2016 23:35        Scheduled Meds: . ceFEPime (MAXIPIME) IV  2 g Intravenous Q24H  . enoxaparin (LOVENOX) injection  40 mg Subcutaneous Q24H  . famotidine (PEPCID) IV  20 mg Intravenous Q24H  . insulin aspart  0-9 Units Subcutaneous Q4H  . mouth rinse  15 mL Mouth Rinse BID   Continuous Infusions: . sodium chloride 100 mL/hr (03/18/16 0849)     LOS: 1 day    Time spent: 25 minutes.     Kathlen Mody, MD [facility] Pager [phone]  If 7PM-7AM, please contact night-coverage [email or web address] Password [email or web address] 03/18/2016, 9:13 AM

## 2016-03-18 NOTE — Progress Notes (Signed)
Patient arrived to room 1514 from ICU. Alert, nonverbal. Right wrist IV infusing NS@100 . Tele box 67, NSR, HR 87. O2 sats 94% on 3L O2 via nasal cannula. Will review orders and continue to monitor.

## 2016-03-19 ENCOUNTER — Inpatient Hospital Stay (HOSPITAL_COMMUNITY): Payer: Medicare Other

## 2016-03-19 DIAGNOSIS — I340 Nonrheumatic mitral (valve) insufficiency: Secondary | ICD-10-CM

## 2016-03-19 LAB — BASIC METABOLIC PANEL
ANION GAP: 9 (ref 5–15)
BUN: 6 mg/dL (ref 6–20)
CHLORIDE: 112 mmol/L — AB (ref 101–111)
CO2: 22 mmol/L (ref 22–32)
Calcium: 8.2 mg/dL — ABNORMAL LOW (ref 8.9–10.3)
Creatinine, Ser: 0.54 mg/dL (ref 0.44–1.00)
GFR calc Af Amer: >60 mL/min (ref >60)
GLUCOSE: 111 mg/dL — AB (ref 65–99)
POTASSIUM: 2.7 mmol/L — AB (ref 3.5–5.1)
Sodium: 143 mmol/L (ref 135–145)

## 2016-03-19 LAB — CBC
HEMATOCRIT: 31.2 % — AB (ref 36.0–46.0)
Hemoglobin: 10.4 g/dL — ABNORMAL LOW (ref 12.0–15.0)
MCH: 30.2 pg (ref 26.0–34.0)
MCHC: 33.3 g/dL (ref 30.0–36.0)
MCV: 90.7 fL (ref 78.0–100.0)
PLATELETS: 141 10*3/uL — AB (ref 150–400)
RBC: 3.44 MIL/uL — AB (ref 3.87–5.11)
RDW: 14.1 % (ref 11.5–15.5)
WBC: 10.6 10*3/uL — ABNORMAL HIGH (ref 4.0–10.5)

## 2016-03-19 LAB — ECHOCARDIOGRAM COMPLETE
HEIGHTINCHES: 58 in
Weight: 1788.37 oz

## 2016-03-19 LAB — POTASSIUM: Potassium: 3.2 mmol/L — ABNORMAL LOW (ref 3.5–5.1)

## 2016-03-19 LAB — GLUCOSE, CAPILLARY
GLUCOSE-CAPILLARY: 97 mg/dL (ref 65–99)
GLUCOSE-CAPILLARY: 104 mg/dL — AB (ref 65–99)
Glucose-Capillary: 114 mg/dL — ABNORMAL HIGH (ref 65–99)
Glucose-Capillary: 105 mg/dL — ABNORMAL HIGH (ref 65–99)
Glucose-Capillary: 124 mg/dL — ABNORMAL HIGH (ref 65–99)

## 2016-03-19 LAB — MAGNESIUM: MAGNESIUM: 1.7 mg/dL (ref 1.7–2.4)

## 2016-03-19 MED ORDER — POTASSIUM CHLORIDE CRYS ER 20 MEQ PO TBCR
40.0000 meq | EXTENDED_RELEASE_TABLET | Freq: Two times a day (BID) | ORAL | Status: AC
Start: 2016-03-19 — End: 2016-03-19
  Administered 2016-03-19 (×2): 40 meq via ORAL
  Filled 2016-03-19 (×2): qty 2

## 2016-03-19 MED ORDER — POTASSIUM CHLORIDE 10 MEQ/100ML IV SOLN
10.0000 meq | INTRAVENOUS | Status: AC
  Administered 2016-03-19 (×4): 10 meq via INTRAVENOUS
  Filled 2016-03-19: qty 100

## 2016-03-19 MED ORDER — MAGNESIUM SULFATE 2 GM/50ML IV SOLN
2.0000 g | Freq: Once | INTRAVENOUS | Status: AC
  Administered 2016-03-19: 2 g via INTRAVENOUS
  Filled 2016-03-19: qty 50

## 2016-03-19 NOTE — Progress Notes (Signed)
PROGRESS NOTE    Paige Flores  ZOX:[id number] DOB: 09-11-1940 DOA: 03/16/2016 PCP: Ron Parker, MD    Brief Narrative: Paige Flores is a 75 y.o.  female with medical history significant of HTN, diabetes type 2, and dementia presents with acute encephalopathy.   She was found to have sepsis from  health care associated pneumonia,.    Assessment & Plan:   Active Problems:   Pacemaker   Sepsis (HCC)   HCAP (healthcare-associated pneumonia)   Lactic acidosis   Acute respiratory failure with hypoxia (HCC)   Elevated troponin   Seizure-like activity (HCC)   Acute encephalopathy   Malnutrition of moderate degree   Sepsis from health care associated pneumonia:  Improving parameters, her hypoxia has improved, she is currently on 2 to 3 liters of [place] oxygen. Respiratory panel is positive for rhino virus.  Started on broad spectrum antibiotics.  SLP evaluation recommending dysphagia 3 diet.   Improving wbc count.  Lactic acid normalized.    Acute respiratory failure with hypoxia: From HEALTH CAP. Improving, wean off the oxygen slowly.   Malnutrition : moderate, get SLP eval.    Acute encephalopathy: - possibly infectious vs from medications . - she is more alert but confused,  - she appears to be at baseline.   Elevated troponins: Demand ischemia from sepsis.  Echocardiogram done.    Hypokalemia: repelte as needed. Repeat in am.   Normocytic anemia:  Stable around 10.  Monitor.    DVT prophylaxis: (Lovenox/ Code Status: DNR.  Family Communication:  None at bedside.  Disposition Plan: pending, further eval.    Consultants:   None    Procedures: none.    Antimicrobials: cefepime 10/25   Subjective: Appear to be comfortable.    Objective: Vitals:   03/18/16 2100 03/18/16 2200 03/18/16 2316 03/19/16 0434  BP: 132/73 (!) 146/91 (!) 144/96 131/84  Pulse: 92 93 89 81  Resp: (!) 23 (!) 21 18 18   Temp:   98.5 F (36.9 C) 97.6 F (36.4 C)  TempSrc:    Oral Oral  SpO2: 96% 100% 94% 100%  Weight:      Height:        Intake/Output Summary (Last 24 hours) at 03/19/16 1839 Last data filed at 03/19/16 1500  Gross per 24 hour  Intake          2366.67 ml  Output                0 ml  Net          2366.67 ml   Filed Weights   03/17/16 0310 03/18/16 0411  Weight: 47.4 kg (104 lb 8 oz) 50.7 kg (111 lb 12.4 oz)    Examination:  General exam: Appears calm and comfortable  Respiratory system: Clear to auscultation. Respiratory effort normal. Cardiovascular system: S1 & S2 heard, RRR. No JVD, murmurs, rubs, gallops or clicks. Pedal edema.  Gastrointestinal system: Abdomen is nondistended, soft and nontender. No organomegaly or masses felt. Normal bowel sounds heard. Central nervous system: alert but confused.  Extremities: Symmetric 5 x 5 power. Skin: No rashes, lesions or ulcers     Data Reviewed: I have personally reviewed following labs and imaging studies  CBC:  Recent Labs Lab 03/16/16 2301 03/17/16 0555 03/19/16 0605  WBC 19.8* 17.8* 10.6*  NEUTROABS 14.8*  --   --   HGB 13.2 12.8 10.4*  HCT 41.8 38.7 31.2*  MCV 96.1 93.7 90.7  PLT 265 208 141*   Basic Metabolic Panel:  Recent Labs Lab 03/16/16 2301 03/17/16 0555 03/18/16 0314 03/19/16 0605  NA 140 142 142 143  K 3.6 3.4* 3.3* 2.7*  CL 106 110 114* 112*  CO2 19* 22 19* 22  GLUCOSE 154* 133* 124* 111*  BUN 18 14 8 6   CREATININE 0.77 0.64 0.61 0.54  CALCIUM 9.2 8.6* 8.7* 8.2*  MG  --   --  1.8 1.7   GFR: Estimated Creatinine Clearance: 41.7 mL/min (by C-G formula based on SCr of 0.54 mg/dL). Liver Function Tests:  Recent Labs Lab 03/16/16 2301 03/17/16 0555  AST 39 39  ALT 33 32  ALKPHOS 109 81  BILITOT 0.7 0.5  PROT 7.2 6.3*  ALBUMIN 3.7 3.2*   No results for input(s): LIPASE, AMYLASE in the last 168 hours.  Recent Labs Lab 03/16/16 2256 03/17/16 0838 03/18/16 0314  AMMONIA 64* 19 40*   Coagulation Profile:  Recent Labs Lab  03/16/16 2301 03/17/16 0144  INR 1.07 1.04   Cardiac Enzymes:  Recent Labs Lab 03/16/16 2301 03/17/16 0135 03/17/16 0555 03/17/16 1159 03/17/16 1821  CKTOTAL  --  174  --   --   --   TROPONINI 0.46*  --  0.07* 0.19* 0.40*   BNP (last 3 results) No results for input(s): PROBNP in the last 8760 hours. HbA1C: No results for input(s): HGBA1C in the last 72 hours. CBG:  Recent Labs Lab 03/18/16 2305 03/19/16 0438 03/19/16 0755 03/19/16 1207 03/19/16 1737  GLUCAP 106* 114* 105* 97 104*   Lipid Profile: No results for input(s): CHOL, HDL, LDLCALC, TRIG, CHOLHDL, LDLDIRECT in the last 72 hours. Thyroid Function Tests:  Recent Labs  03/18/16 0314  TSH 1.321   Anemia Panel: No results for input(s): VITAMINB12, FOLATE, FERRITIN, TIBC, IRON, RETICCTPCT in the last 72 hours. Sepsis Labs:  Recent Labs Lab 03/17/16 0144 03/17/16 0153 03/17/16 0555 03/17/16 0838 03/18/16 0933  PROCALCITON <0.10  --   --   --   --   LATICACIDVEN  --  2.29* 3.2* 2.0* 1.7    Recent Results (from the past 240 hour(s))  Culture, blood (x 2)     Status: None (Preliminary result)   Collection Time: 03/16/16 11:00 PM  Result Value Ref Range Status   Specimen Description BLOOD LEFT ANTECUBITAL  Final   Special Requests BOTTLES DRAWN AEROBIC AND ANAEROBIC 5CC EACH  Final   Culture   Final    NO GROWTH 2 DAYS Performed at [facility]    Report Status PENDING  Incomplete  Culture, blood (x 2)     Status: None (Preliminary result)   Collection Time: 03/17/16  1:35 AM  Result Value Ref Range Status   Specimen Description BLOOD RIGHT ANTECUBITAL  Final   Special Requests BOTTLES DRAWN AEROBIC AND ANAEROBIC 5CC EACH  Final   Culture   Final    NO GROWTH 2 DAYS Performed at [facility]    Report Status PENDING  Incomplete  Respiratory Panel by PCR     Status: Abnormal   Collection Time: 03/17/16  3:46 AM  Result Value Ref Range Status   Adenovirus NOT DETECTED NOT  DETECTED Final   Coronavirus 229E NOT DETECTED NOT DETECTED Final   Coronavirus HKU1 NOT DETECTED NOT DETECTED Final   Coronavirus NL63 NOT DETECTED NOT DETECTED Final   Coronavirus OC43 NOT DETECTED NOT DETECTED Final   Metapneumovirus NOT DETECTED NOT DETECTED Final   Rhinovirus / Enterovirus DETECTED (A) NOT DETECTED Final   Influenza A NOT DETECTED  NOT DETECTED Final   Influenza B NOT DETECTED NOT DETECTED Final   Parainfluenza Virus 1 NOT DETECTED NOT DETECTED Final   Parainfluenza Virus 2 NOT DETECTED NOT DETECTED Final   Parainfluenza Virus 3 NOT DETECTED NOT DETECTED Final   Parainfluenza Virus 4 NOT DETECTED NOT DETECTED Final   Respiratory Syncytial Virus NOT DETECTED NOT DETECTED Final   Bordetella pertussis NOT DETECTED NOT DETECTED Final   Chlamydophila pneumoniae NOT DETECTED NOT DETECTED Final   Mycoplasma pneumoniae NOT DETECTED NOT DETECTED Final    Comment: [facility]  MRSA PCR Screening     Status: None   Collection Time: 03/17/16  3:47 AM  Result Value Ref Range Status   MRSA by PCR NEGATIVE NEGATIVE Final    Comment:        The GeneXpert MRSA Assay (FDA approved for NASAL specimens only), is one component of a comprehensive MRSA colonization surveillance program. It is not intended to diagnose MRSA infection nor to guide or monitor treatment for MRSA infections.          Radiology Studies: No results found.      Scheduled Meds: . ceFEPime (MAXIPIME) IV  2 g Intravenous Q24H  . famotidine (PEPCID) IV  20 mg Intravenous Q24H  . insulin aspart  0-9 Units Subcutaneous Q4H  . mouth rinse  15 mL Mouth Rinse BID  . potassium chloride  40 mEq Oral BID   Continuous Infusions: . sodium chloride 100 mL/hr at 03/19/16 0549     LOS: 2 days    Time spent: 25 minutes.     Kathlen ModyAKULA,Lachina Salsberry, MD [facility] Pager [phone]  If 7PM-7AM, please contact night-coverage [email or web address] Password [email or web address] 03/19/2016, 6:39 PM

## 2016-03-19 NOTE — Progress Notes (Signed)
Nutrition Follow-up  DOCUMENTATION CODES:   Non-severe (moderate) malnutrition in context of chronic illness  INTERVENTION:  - If unable to advance diet, recommend Osmolite 1.2 @ 15 mL/hr to increase by 10 mL every 12 hours to reach goal rate of Osmolite 1.2 @ 45 mL/hr. At goal rate, this regimen will provide 1296 kcal, 60 grams of protein, and 886 mL free water.  - Monitor magnesium, potassium, and phosphorus daily for at least 3 days, MD to replete as needed, as pt is at risk for refeeding syndrome given unknown period of inability to consume adequate PO PTA and NPO x3 days (since admission). - RD will follow-up 10/30.  NUTRITION DIAGNOSIS:   Inadequate oral intake related to inability to eat as evidenced by NPO status. -ongoing  GOAL:   Patient will meet greater than or equal to 90% of their needs -unmet with pt remaining NPO since admission.  MONITOR:   Diet advancement, Weight trends, Labs, I & O's  ASSESSMENT:   75 y.o.  female with medical history significant of HTN, diabetes type 2, and dementia; who presents after being found by staff acutely altered. History is obtained by one of the patient's nursing home caregivers and daughter who is present at bedside as the patient has dementia and is acutely altered. Patient lives in [place] house and at baseline is nonverbal and will intermittently smile. Caregiver notes that she was not taking any of her medications today and was found acutely foaming at the mouth with rigid posture with hands curled up together as though she were having a seizure. No previous history of seizure or requiring oxygen to maintain O2 sats. She is noted to intermittently grunt and intermittently jerk. Patient has had the intermittent jerking for over a year but it had recently gotten worse in the last few months. She had been recently started on gabapentin. Patient had last fallen out of bed thought to be related to these jerks and had come to the ED multiple  times for falls last noted to come on 10/21. At that time family noted that there was no signs of infection or issues from lab work done.   10/26 Pt remains NPO since admission. No new weight since admission. Per MD note yesterday AM, plan for SLP evaluation with associated possible diet advancement. If diet unable to be advanced and if within POC/GOC, TF recommendations outlined above. Pt transferred from 2 [place] to 5 [place] yesterday ~2250.  Medications reviewed; 20 mg IV Pepcid BID, sliding scale Novolog, PRN Zofran, 10 mEq IV KCl x4 runs today, 40 mEq oral KCl BID. Labs reviewed; CBGs: 105 and 114 mg/dL, K: 2.7 mmol/L, Cl: 478 mmol/L, Ca: 8.2 mg/dL.  IVF: NS @ 100 mL/hr.    10/24 Pt seen for low [place]. BMI indicates normal weight status. Pt has been NPO since admission and unable to meet estimated nutrition needs. Per rounds, pt is nonverbal at baseline; no family/visitors present to provide PTA information. Also per rounds, pt with 2 recent admissions for falls. RN note from today at 1053 reviewed.   Physical assessment shows mild muscle and fat wasting to upper body and mild to moderate muscle wasting to lower body. No edema present at this time. Only other weight available at this time is from 02/01/16 and shows weight of 130 lbs; question accuracy of this based on information available in notes from PTA while at [facility]. Will continue to monitor weight trends during admission.   Medications reviewed; 20 mg IV  Pepcid BID, sliding scale Novolog, 300 mL lactulose TID, PRN Zofran. Labs reviewed; CBGs: 102 and 168 mg/dL this AM, K: 3.4 mmol/L, Ca: 8.6 mg/dL, ammonia now WDL. IVF: NS @ 100 mL/hr.   ADDENDUM: Daughter reports that pt was not eating for unknown period of time PTA as she was not awake/alert enough to do so; she was informed of this by facility staff PTA. Daughter then requested that pt be provided with protein shakes as she felt that pt appeared malnourished.    Diet  Order:  Diet NPO time specified  Skin:  Reviewed, no issues  Last BM:  10/25  Height:   Ht Readings from Last 1 Encounters:  03/17/16 4\' 10"  (1.473 m)    Weight:   Wt Readings from Last 1 Encounters:  03/18/16 111 lb 12.4 oz (50.7 kg)    Ideal Body Weight:  42.18 kg  BMI:  Body mass index is 23.36 kg/m.  Estimated Nutritional Needs:   Kcal:  1185-1327 (20-23 kcal/kg)  Protein:  47-57 grams (1-1.2 grams/kg)  Fluid:  >/= 1.2 L/day  EDUCATION NEEDS:   No education needs identified at this time    Trenton GammonJessica Rayah Fines, MS, RD, LDN Inpatient Clinical Dietitian Pager # [phone] After hours/weekend pager # [phone]

## 2016-03-19 NOTE — Evaluation (Signed)
Clinical/Bedside Swallow Evaluation Patient Details  Name: Paige Flores MRN: [id number] Date of Birth: [birth year]  Today's Date: 03/19/2016 Time: SLP Start Time (ACUTE ONLY): 1340 SLP Stop Time (ACUTE ONLY): 1400 SLP Time Calculation (min) (ACUTE ONLY): 20 min  Past Medical History:  Past Medical History:  Diagnosis Date  . Aphasia   . Bradycardia   . Dementia   . Depression   . Diabetes mellitus without complication (HCC)   . Hypertension    Past Surgical History: History reviewed. No pertinent surgical history. HPI:  75 y.o.  female with medical history significant of HTN, diabetes type 2, and dementia; who presents after being found by staff acutely altered per MD note. History is obtained by one of the patient's nursing home caregivers and daughter who is present at bedside as the patient has dementia and is acutely altered. Patient lives in [facility] [place] and at baseline is nonverbal and will intermittently smile per MD note. Caregiver reported to MD that pt was not taking any of her medications today and was found acutely foaming at the mouth with rigid posture with hands curled up together as though she were having a seizure. No previous history of seizure or requiring oxygen to maintain O2 sats. She is noted to intermittently grunt and intermittently jerk. Patient has had the intermittent jerking for over a year but it had recently gotten worse in the last few months. She had been recently started on gabapentin. Patient had last fallen out of bed thought to be related to these jerks and had come to the ED multiple times for falls last noted to come on 10/21.     Assessment / Plan / Recommendation Clinical Impression  Pt presents with functional oropharyngeal swallow ability.  No indication of airway compromise with po SLP fed to patient *pt had safety mitts in place.   Pt demonstrated delayed automatic responses to open her mouth to accept po, however once accepted, pt tolerated  well.  She demonstrated adequate rotary mastication abilities without oral residuals.   Per RN, pt on soft diet at facility.  SlP recommends to return to premorbid diet with general precautions.  NO SLP follow up indicated, RN informed.       Aspiration Risk  Mild aspiration risk    Diet Recommendation Dysphagia 3 (Mech soft);Thin liquid   Liquid Administration via: Cup;Straw Medication Administration:  (with puree, crush if large and not contraindicated) Supervision: Patient able to self feed;Comment (set up assist) Compensations: Slow rate;Small sips/bites;Minimize environmental distractions Postural Changes: Seated upright at 90 degrees    Other  Recommendations Oral Care Recommendations: Oral care BID   Follow up Recommendations        Frequency and Duration            Prognosis        Swallow Study   General Date of Onset: 03/19/16 HPI: 75 y.o.  female with medical history significant of HTN, diabetes type 2, and dementia; who presents after being found by staff acutely altered. History is obtained by one of the patient's nursing home caregivers and daughter who is present at bedside as the patient has dementia and is acutely altered. Patient lives in [place] and at baseline is nonverbal and will intermittently smile. Caregiver notes that she was not taking any of her medications today and was found acutely foaming at the mouth with rigid posture with hands curled up together as though she were having a seizure. No previous history of seizure or requiring  oxygen to maintain O2 sats. She is noted to intermittently grunt and intermittently jerk. Patient has had the intermittent jerking for over a year but it had recently gotten worse in the last few months. She had been recently started on gabapentin. Patient had last fallen out of bed thought to be related to these jerks and had come to the ED multiple times for falls last noted to come on 10/21.   Type of Study: Bedside  Swallow Evaluation Temperature Spikes Noted: No Respiratory Status: Nasal cannula History of Recent Intubation: No Behavior/Cognition: Alert Oral Cavity Assessment: Within Functional Limits Oral Care Completed by SLP: No Oral Cavity - Dentition: Adequate natural dentition Vision: Functional for self-feeding Self-Feeding Abilities: Able to feed self Patient Positioning: Upright in bed Baseline Vocal Quality: Normal Volitional Cough: Cognitively unable to elicit Volitional Swallow: Unable to elicit    Oral/Motor/Sensory Function Overall Oral Motor/Sensory Function: Within functional limits   Ice Chips Ice chips: Not tested   Thin Liquid Thin Liquid: Within functional limits Presentation: Cup;Straw    Nectar Thick Nectar Thick Liquid: Not tested   Honey Thick Honey Thick Liquid: Not tested   Puree Puree: Within functional limits Presentation: Spoon   Solid   GO   Solid: Within functional limits Presentation: [facility]        Paige Bill, MS [facility] SLP [phone]

## 2016-03-19 NOTE — Clinical Social Work Note (Addendum)
Clinical Social Work Assessment  Patient Details  Name: Paige Flores MRN: [id number] Date of Birth: [birth year]  Date of referral:  03/19/16               Reason for consult:  Facility Placement, Discharge Planning                Permission sought to share information with:  Family Supports, Customer service manager Permission granted to share information::     Name::      [place]  Agency::   [facility]  Relationship::   Daughter  Contact Information:   [phone], [phone]  Housing/Transportation Living arrangements for the past 2 months:  [place] of Information:  Patient Patient Interpreter Needed:  None Criminal Activity/Legal Involvement Pertinent to Current Situation/Hospitalization:  No - Comment as needed Significant Relationships:  Adult Children Lives with:  Facility Resident Do you feel safe going back to the place where you live?  Yes Need for family participation in patient care:  Yes (Comment)  Care giving concerns: Patient daughter/POA expects patient to return to ALF.  She expresses the patient has been to three different facilities in the past two years and she has not found a facility that meets her standards to care for patient. She feels she has no choice because she cannot care for patient at her own home.    Social Worker assessment / plan: LCSWA met with patient daughter and patient at bedside, explained reason for consult. Patient is non-verbal and has dementia.The patient daughter reports the patient will return to ALF. She gave LCSWA permission to speak with the facility and update them if questions arise. Patient daughter expressed concerns with patient care and illness pneumonia. LCSWA provided emotional support and encouraged patient daughter to talk with facility directors, nurses etc. About her concerns.  Plan: Assist with patiient discharge planning.LCSWA will complete FL2.   Employment status:    Insurance  information:  Medicare PT Recommendations:  Not assessed at this time Information / Referral to community resources:     Patient/Family's Response to care:  Agreeable to care.   Patient/Family's Understanding of and Emotional Response to Diagnosis, Current Treatment, and Prognosis:  Patient daughter expresses patient long term care. She is hopeful that her mother will regain strength and weight and get back to her baseline.   Emotional Assessment Appearance:  Appears older than stated age Attitude/Demeanor/Rapport:    Affect (typically observed):  Calm Orientation:  Oriented to Self Alcohol / Substance use:  Not Applicable Psych involvement (Current and /or in the community):  No (Comment)  Discharge Needs  Concerns to be addressed:  Discharge Planning Concerns Readmission within the last 30 days:    Current discharge risk:  Dependent with Mobility Barriers to Discharge:  Continued Medical Work up   [employer], LCSW 03/19/2016, 9:47 AM

## 2016-03-19 NOTE — Progress Notes (Signed)
  Echocardiogram 2D Echocardiogram has been performed.  Paige Flores, Pa[facility] 03/19/2016, 9:38 AM

## 2016-03-20 LAB — CBC
HEMATOCRIT: 32 % — AB (ref 36.0–46.0)
HEMOGLOBIN: 10.4 g/dL — AB (ref 12.0–15.0)
MCH: 30.7 pg (ref 26.0–34.0)
MCHC: 32.5 g/dL (ref 30.0–36.0)
MCV: 94.4 fL (ref 78.0–100.0)
Platelets: 145 10*3/uL — ABNORMAL LOW (ref 150–400)
RBC: 3.39 MIL/uL — ABNORMAL LOW (ref 3.87–5.11)
RDW: 14.5 % (ref 11.5–15.5)
WBC: 7.9 10*3/uL (ref 4.0–10.5)

## 2016-03-20 LAB — BASIC METABOLIC PANEL
ANION GAP: 6 (ref 5–15)
BUN: 7 mg/dL (ref 6–20)
CHLORIDE: 112 mmol/L — AB (ref 101–111)
CO2: 25 mmol/L (ref 22–32)
Calcium: 8.3 mg/dL — ABNORMAL LOW (ref 8.9–10.3)
Creatinine, Ser: 0.58 mg/dL (ref 0.44–1.00)
GFR calc Af Amer: >60 mL/min (ref >60)
GLUCOSE: 106 mg/dL — AB (ref 65–99)
POTASSIUM: 3.3 mmol/L — AB (ref 3.5–5.1)
Sodium: 143 mmol/L (ref 135–145)

## 2016-03-20 LAB — GLUCOSE, CAPILLARY
GLUCOSE-CAPILLARY: 117 mg/dL — AB (ref 65–99)
GLUCOSE-CAPILLARY: 143 mg/dL — AB (ref 65–99)
Glucose-Capillary: 103 mg/dL — ABNORMAL HIGH (ref 65–99)
Glucose-Capillary: 101 mg/dL — ABNORMAL HIGH (ref 65–99)
Glucose-Capillary: 153 mg/dL — ABNORMAL HIGH (ref 65–99)

## 2016-03-20 MED ORDER — POTASSIUM CHLORIDE CRYS ER 20 MEQ PO TBCR
40.0000 meq | EXTENDED_RELEASE_TABLET | Freq: Two times a day (BID) | ORAL | Status: AC
  Administered 2016-03-20 (×2): 40 meq via ORAL
  Filled 2016-03-20 (×2): qty 2

## 2016-03-20 NOTE — Progress Notes (Signed)
Physician notified about patient disposition back to ALF-[facility]

## 2016-03-20 NOTE — NC FL2 (Signed)
[place] MEDICAID FL2 LEVEL OF CARE SCREENING TOOL     IDENTIFICATION  Patient Name: Kenyona Rena Birthdate: 06/19/1940 Sex: female Admission Date (Current Location): 03/16/2016  [facility] and [place] Number:  Producer, television/film/video and Address:  [facility],  [address] [place] [address]      Provider Number: [id number]  Attending Physician Name and Address:  Kathlen Mody, MD  Relative Name and Phone Number:       Current Level of Care: Hospital Recommended Level of Care: Assisted Living Facility, Memory Care [facility]) Prior Approval Number:    Date Approved/Denied:   PASRR Number:    Discharge Plan:  (ALF-MemoryCare)    Current Diagnoses: Patient Active Problem List   Diagnosis Date Noted  . Malnutrition of moderate degree 03/18/2016  . Sepsis (HCC) 03/17/2016  . HCAP (healthcare-associated pneumonia) 03/17/2016  . Lactic acidosis 03/17/2016  . Acute respiratory failure with hypoxia (HCC) 03/17/2016  . Elevated troponin 03/17/2016  . Seizure-like activity (HCC) 03/17/2016  . Acute encephalopathy 03/17/2016  . Paroxysmal atrial tachycardia (HCC) 01/03/2016  . SSS (sick sinus syndrome) (HCC) 01/03/2016  . Pacemaker 01/03/2016  . Murmur 01/03/2016  . Hyperlipidemia 01/03/2016    Orientation RESPIRATION BLADDER Height & Weight     Self  Normal Incontinent Weight: 111 lb 12.4 oz (50.7 kg) Height:  4\' 10"  (147.3 cm)  BEHAVIORAL SYMPTOMS/MOOD NEUROLOGICAL BOWEL NUTRITION STATUS      Incontinent Diet: See Discharge Summary   AMBULATORY STATUS COMMUNICATION OF NEEDS Skin   Total Care Non-Verbally Normal                       Personal Care Assistance Level of Assistance  Bathing, Feeding, Dressing Bathing Assistance: Maximum assistance Feeding assistance: Limited assistance Dressing Assistance: Maximum assistance     Functional Limitations Info  Sight, Hearing, Speech Sight Info: Adequate Hearing Info: Adequate Speech Info:  Impaired    SPECIAL CARE FACTORS FREQUENCY                       Contractures Contractures Info: Not present    Additional Factors Info  Code Status, Allergies Code Status Info: DNR Allergies Info: No Known allergies           Current Medications (03/20/2016):  This is the current hospital active medication list Current Facility-Administered Medications  Medication Dose Route Frequency Provider Last Rate Last Dose  . 0.9 %  sodium chloride infusion   Intravenous Continuous Clydie Braun, MD 100 mL/hr at 03/20/16 8119    . acetaminophen (TYLENOL) tablet 650 mg  650 mg Oral Q6H PRN Clydie Braun, MD       Or  . acetaminophen (TYLENOL) suppository 650 mg  650 mg Rectal Q6H PRN Clydie Braun, MD   650 mg at 03/17/16 2102  . albuterol (PROVENTIL) (2.5 MG/3ML) 0.083% nebulizer solution 2.5 mg  2.5 mg Nebulization Q4H PRN Clydie Braun, MD      . ceFEPIme (MAXIPIME) 2 g in dextrose 5 % 50 mL IVPB  2 g Intravenous Q24H Clydie Braun, MD   2 g at 03/19/16 2100  . famotidine (PEPCID) IVPB 20 mg premix  20 mg Intravenous Q24H Kathlen Mody, MD   20 mg at 03/19/16 2100  . insulin aspart (novoLOG) injection 0-9 Units  0-9 Units Subcutaneous Q4H Clydie Braun, MD   2 Units at 03/20/16 1236  . ipratropium-albuterol (DUONEB) 0.5-2.5 (3) MG/3ML nebulizer solution 3  mL  3 mL Nebulization Q4H PRN Albertine GratesFang Xu, MD      . LORazepam (ATIVAN) injection 1-2 mg  1-2 mg Intravenous PRN Clydie Braunondell A Smith, MD      . MEDLINE mouth rinse  15 mL Mouth Rinse BID Clydie Braunondell A Smith, MD   15 mL at 03/20/16 1043  . ondansetron (ZOFRAN) tablet 4 mg  4 mg Oral Q6H PRN Clydie Braunondell A Smith, MD       Or  . ondansetron (ZOFRAN) injection 4 mg  4 mg Intravenous Q6H PRN Rondell A Katrinka BlazingSmith, MD      . potassium chloride SA (K-DUR,KLOR-CON) CR tablet 40 mEq  40 mEq Oral BID Kathlen ModyVijaya Akula, MD   40 mEq at 03/20/16 1042     Discharge Medications: Medication List    STOP taking these medications   gabapentin 300 MG  capsule Commonly known as:  NEURONTIN  guaifenesin 100 MG/5ML syrup Commonly known as:  ROBITUSSIN  hydrOXYzine 10 MG tablet Commonly known as:  ATARAX/VISTARIL  loperamide 2 MG capsule Commonly known as:  IMODIUM    TAKE these medications   acetaminophen 500 MG tablet Commonly known as:  TYLENOL Take 500 mg by mouth every 4 (four) hours as needed for mild pain.  alum & mag hydroxide-simeth 400-400-40 MG/5ML suspension Commonly known as:  MAALOX PLUS Take 15 mLs by mouth every 6 (six) hours as needed for indigestion.  cholecalciferol 1000 units tablet Commonly known as:  VITAMIN D Take 2,000 Units by mouth daily.  diltiazem 180 MG 24 hr capsule Commonly known as:  DILACOR XR Take 180 mg by mouth daily.  escitalopram 10 MG tablet Commonly known as:  LEXAPRO Take 10 mg by mouth daily.  magnesium hydroxide 400 MG/5ML suspension Commonly known as:  MILK OF MAGNESIA Take 30 mLs by mouth at bedtime as needed for mild constipation.  magnesium oxide 400 MG tablet Commonly known as:  MAG-OX Take 400 mg by mouth daily.  Melatonin 5 MG Tabs Take 1 tablet by mouth daily.  neomycin-bacitracin-polymyxin ointment Commonly known as:  NEOSPORIN Apply 1 application topically as needed for wound care. apply to eye  OVER THE COUNTER MEDICATION Take 1 each by mouth 4 (four) times daily.     Relevant Imaging Results:  Relevant Lab Results:   Additional Information [id number]  Clearance CootsNicole A Davia Smyre, LCSW

## 2016-03-20 NOTE — Progress Notes (Signed)
Pharmacy Antibiotic Note  Paige Flores is a 75 y.o. female admitted on 03/16/2016 with pneumonia and sepsis.  Pharmacy initially consulted for vancomycin and cefepime dosing; vancomycin dc'd 10/24 since MRSA PCR (-).  Today, 03/20/2016 Tmax 99.5 WBC normalized 7.9 SCr stable, CrCl 42 ml/min Lactate normal 1.7 (10/25) O2 sats 100% on RA  Plan: - Continue Cefepime 2gm q24hr - Once able to transition to PO, consider cefpodoxime 200mg  BID (can be crushed)  Height: 4\' 10"  (147.3 cm) Weight: 111 lb 12.4 oz (50.7 kg) IBW/kg (Calculated) : 40.9  Temp (24hrs), Avg:99.1 F (37.3 C), Min:98.7 F (37.1 C), Max:99.5 F (37.5 C)   Recent Labs Lab 03/16/16 2301  03/17/16 0135 03/17/16 0153 03/17/16 0555 03/17/16 [id number] 03/18/16 0314 03/18/16 0933 03/19/16 0605 03/20/16 0553  WBC 19.8*  --   --   --  17.8*  --   --   --  10.6* 7.9  CREATININE 0.77  --   --   --  0.64  --  0.61  --  0.54 0.58  LATICACIDVEN  --   < > 2.1* 2.29* 3.2* 2.0*  --  1.7  --   --   < > = values in this interval not displayed.  Estimated Creatinine Clearance: 41.7 mL/min (by C-G formula based on SCr of 0.58 mg/dL).    No Known Allergies  Antimicrobials this admission:  Vancomycin 10/24 >> 10/24 Cefepime 10/24 >>   Dose adjustments this admission:  --  Microbiology results:  10/24 BCx: NGTD 10/24 MRSA PCR: negative  10/24 respiratory virus panel: rhinovirus/enterovirus detected 10/24 Urine strep/legionella: neg/neg  Thank you for allowing pharmacy to be a part of this patient's care.  Loralee PacasErin Mykale Gandolfo, PharmD, BCPS Pager: [phone] 03/20/2016 7:42 AM

## 2016-03-20 NOTE — Progress Notes (Signed)
PROGRESS NOTE    Paige Flores  WUJ:[id number] DOB: 11/18/1940 DOA: 03/16/2016 PCP: Ron Parker, MD    Brief Narrative: Paige Flores is a 75 y.o.  female with medical history significant of HTN, diabetes type 2, and dementia presents with acute encephalopathy.   She was found to have sepsis from  health care associated pneumonia,.    Assessment & Plan:   Active Problems:   Pacemaker   Sepsis (HCC)   HCAP (healthcare-associated pneumonia)   Lactic acidosis   Acute respiratory failure with hypoxia (HCC)   Elevated troponin   Seizure-like activity (HCC)   Acute encephalopathy   Malnutrition of moderate degree   Sepsis from health care associated pneumonia:  Improving parameters, her hypoxia has improved, she is currently on 1 to 2 liters of [facility] oxygen. Respiratory panel is positive for rhino virus.  Started on broad spectrum antibiotics. Completed 4 days of cefepime, change to oral antibiotics in am.  SLP evaluation recommending dysphagia 3 diet.   Improving wbc count. Normal white count this am.  Lactic acid normalized.    Acute respiratory failure with hypoxia: From health care associated pneumonia,  Improving, wean off the oxygen slowly.   Malnutrition : moderate, get SLP eval.    Acute encephalopathy: - possibly infectious vs from medications . - she is more alert but confused,  - she appears to be at baseline.   Elevated troponins: Demand ischemia from sepsis.  Echocardiogram done shows Vigorous LV systolic function; moderate LVH; grade 1 diastolic   dysfunction; calcified aortic valve with moderate AS ; trace AI; mild MR; mild LAE.   Hypokalemia: repelte as needed. Repeat in am shows 3.3 further supplementation added. .   Normocytic anemia:  Stable around 10.  Monitor.   Moderate malnutrition: Dietician consulted.   DVT prophylaxis: (Lovenox/ Code Status: DNR.  Family Communication:  None at bedside. Discussed with daughter on Thursday.  Disposition  Plan: plan for d/c in 1 to 2 days. .    Consultants:   None    Procedures: none.    Antimicrobials: cefepime 10/24   Subjective: Appear to be comfortable. As per daughter pt had erky movements while at SNF, none since admission.    Objective: Vitals:   03/19/16 0434 03/19/16 2210 03/20/16 0539 03/20/16 1407  BP: 131/84 (!) 136/95 (!) 137/93 133/79  Pulse: 81 80 84 95  Resp: 18 18 20 17   Temp: 97.6 F (36.4 C) 99.5 F (37.5 C) 98.7 F (37.1 C) 98.4 F (36.9 C)  TempSrc: Oral Oral Axillary Axillary  SpO2: 100% 100% 100% 100%  Weight:      Height:        Intake/Output Summary (Last 24 hours) at 03/20/16 1750 Last data filed at 03/20/16 1606  Gross per 24 hour  Intake          3201.67 ml  Output                0 ml  Net          3201.67 ml   Filed Weights   03/17/16 0310 03/18/16 0411  Weight: 47.4 kg (104 lb 8 oz) 50.7 kg (111 lb 12.4 oz)    Examination:  General exam: Appears calm and comfortable  Respiratory system: Clear to auscultation. Respiratory effort normal. Cardiovascular system: S1 & S2 heard, RRR. No JVD, murmurs, rubs, gallops or clicks. Pedal edema.  Gastrointestinal system: Abdomen is nondistended, soft and nontender. No organomegaly or masses felt. Normal bowel sounds heard. Central nervous  system: alert but confused.  Extremities: Symmetric 5 x 5 power. Skin: No rashes, lesions or ulcers     Data Reviewed: I have personally reviewed following labs and imaging studies  CBC:  Recent Labs Lab 03/16/16 2301 03/17/16 0555 03/19/16 0605 03/20/16 0553  WBC 19.8* 17.8* 10.6* 7.9  NEUTROABS 14.8*  --   --   --   HGB 13.2 12.8 10.4* 10.4*  HCT 41.8 38.7 31.2* 32.0*  MCV 96.1 93.7 90.7 94.4  PLT 265 208 141* 145*   Basic Metabolic Panel:  Recent Labs Lab 03/16/16 2301 03/17/16 0555 03/18/16 0314 03/19/16 0605 03/19/16 1903 03/20/16 0553  NA 140 142 142 143  --  143  K 3.6 3.4* 3.3* 2.7* 3.2* 3.3*  CL 106 110 114* 112*  --   112*  CO2 19* 22 19* 22  --  25  GLUCOSE 154* 133* 124* 111*  --  106*  BUN 18 14 8 6   --  7  CREATININE 0.77 0.64 0.61 0.54  --  0.58  CALCIUM 9.2 8.6* 8.7* 8.2*  --  8.3*  MG  --   --  1.8 1.7  --   --    GFR: Estimated Creatinine Clearance: 41.7 mL/min (by C-G formula based on SCr of 0.58 mg/dL). Liver Function Tests:  Recent Labs Lab 03/16/16 2301 03/17/16 0555  AST 39 39  ALT 33 32  ALKPHOS 109 81  BILITOT 0.7 0.5  PROT 7.2 6.3*  ALBUMIN 3.7 3.2*   No results for input(s): LIPASE, AMYLASE in the last 168 hours.  Recent Labs Lab 03/16/16 2256 03/17/16 0838 03/18/16 0314  AMMONIA 64* 19 40*   Coagulation Profile:  Recent Labs Lab 03/16/16 2301 03/17/16 0144  INR 1.07 1.04   Cardiac Enzymes:  Recent Labs Lab 03/16/16 2301 03/17/16 0135 03/17/16 0555 03/17/16 1159 03/17/16 1821  CKTOTAL  --  174  --   --   --   TROPONINI 0.46*  --  0.07* 0.19* 0.40*   BNP (last 3 results) No results for input(s): PROBNP in the last 8760 hours. HbA1C: No results for input(s): HGBA1C in the last 72 hours. CBG:  Recent Labs Lab 03/19/16 2055 03/20/16 0413 03/20/16 0806 03/20/16 1148 03/20/16 1609  GLUCAP 124* 103* 101* 153* 117*   Lipid Profile: No results for input(s): CHOL, HDL, LDLCALC, TRIG, CHOLHDL, LDLDIRECT in the last 72 hours. Thyroid Function Tests:  Recent Labs  03/18/16 0314  TSH 1.321   Anemia Panel: No results for input(s): VITAMINB12, FOLATE, FERRITIN, TIBC, IRON, RETICCTPCT in the last 72 hours. Sepsis Labs:  Recent Labs Lab 03/17/16 0144 03/17/16 0153 03/17/16 0555 03/17/16 0838 03/18/16 0933  PROCALCITON <0.10  --   --   --   --   LATICACIDVEN  --  2.29* 3.2* 2.0* 1.7    Recent Results (from the past 240 hour(s))  Culture, blood (x 2)     Status: None (Preliminary result)   Collection Time: 03/16/16 11:00 PM  Result Value Ref Range Status   Specimen Description BLOOD LEFT ANTECUBITAL  Final   Special Requests BOTTLES DRAWN  AEROBIC AND ANAEROBIC 5CC EACH  Final   Culture   Final    NO GROWTH 3 DAYS Performed at [facility]    Report Status PENDING  Incomplete  Culture, blood (x 2)     Status: None (Preliminary result)   Collection Time: 03/17/16  1:35 AM  Result Value Ref Range Status   Specimen Description BLOOD RIGHT ANTECUBITAL  Final   Special Requests BOTTLES DRAWN AEROBIC AND ANAEROBIC 5CC EACH  Final   Culture   Final    NO GROWTH 3 DAYS Performed at [facility]    Report Status PENDING  Incomplete  Respiratory Panel by PCR     Status: Abnormal   Collection Time: 03/17/16  3:46 AM  Result Value Ref Range Status   Adenovirus NOT DETECTED NOT DETECTED Final   Coronavirus 229E NOT DETECTED NOT DETECTED Final   Coronavirus HKU1 NOT DETECTED NOT DETECTED Final   Coronavirus NL63 NOT DETECTED NOT DETECTED Final   Coronavirus OC43 NOT DETECTED NOT DETECTED Final   Metapneumovirus NOT DETECTED NOT DETECTED Final   Rhinovirus / Enterovirus DETECTED (A) NOT DETECTED Final   Influenza A NOT DETECTED NOT DETECTED Final   Influenza B NOT DETECTED NOT DETECTED Final   Parainfluenza Virus 1 NOT DETECTED NOT DETECTED Final   Parainfluenza Virus 2 NOT DETECTED NOT DETECTED Final   Parainfluenza Virus 3 NOT DETECTED NOT DETECTED Final   Parainfluenza Virus 4 NOT DETECTED NOT DETECTED Final   Respiratory Syncytial Virus NOT DETECTED NOT DETECTED Final   Bordetella pertussis NOT DETECTED NOT DETECTED Final   Chlamydophila pneumoniae NOT DETECTED NOT DETECTED Final   Mycoplasma pneumoniae NOT DETECTED NOT DETECTED Final    Comment: Performed at [facility]  MRSA PCR Screening     Status: None   Collection Time: 03/17/16  3:47 AM  Result Value Ref Range Status   MRSA by PCR NEGATIVE NEGATIVE Final    Comment:        The GeneXpert MRSA Assay (FDA approved for NASAL specimens only), is one component of a comprehensive MRSA colonization surveillance program. It is not intended to  diagnose MRSA infection nor to guide or monitor treatment for MRSA infections.          Radiology Studies: No results found.      Scheduled Meds: . ceFEPime (MAXIPIME) IV  2 g Intravenous Q24H  . famotidine (PEPCID) IV  20 mg Intravenous Q24H  . insulin aspart  0-9 Units Subcutaneous Q4H  . mouth rinse  15 mL Mouth Rinse BID  . potassium chloride  40 mEq Oral BID   Continuous Infusions: . sodium chloride 100 mL/hr at 03/20/16 1606     LOS: 3 days    Time spent: 25 minutes.     Kathlen ModyAKULA,Roben Schliep, MD [facility] Pager [phone]  If 7PM-7AM, please contact night-coverage [email or web address] Password [email or web address] 03/20/2016, 5:50 PM

## 2016-03-21 LAB — BASIC METABOLIC PANEL
Anion gap: 6 (ref 5–15)
BUN: 6 mg/dL (ref 6–20)
CO2: 26 mmol/L (ref 22–32)
Calcium: 8.5 mg/dL — ABNORMAL LOW (ref 8.9–10.3)
Chloride: 110 mmol/L (ref 101–111)
Creatinine, Ser: 0.57 mg/dL (ref 0.44–1.00)
GFR calc Af Amer: >60 mL/min (ref >60)
GLUCOSE: 101 mg/dL — AB (ref 65–99)
POTASSIUM: 3.2 mmol/L — AB (ref 3.5–5.1)
Sodium: 142 mmol/L (ref 135–145)

## 2016-03-21 LAB — GLUCOSE, CAPILLARY
GLUCOSE-CAPILLARY: 111 mg/dL — AB (ref 65–99)
GLUCOSE-CAPILLARY: 133 mg/dL — AB (ref 65–99)
Glucose-Capillary: 123 mg/dL — ABNORMAL HIGH (ref 65–99)
Glucose-Capillary: 103 mg/dL — ABNORMAL HIGH (ref 65–99)
Glucose-Capillary: 91 mg/dL (ref 65–99)
Glucose-Capillary: 83 mg/dL (ref 65–99)

## 2016-03-21 MED ORDER — LEVOFLOXACIN 750 MG PO TABS
750.0000 mg | ORAL_TABLET | Freq: Every day | ORAL | Status: AC
  Administered 2016-03-22: 750 mg via ORAL
  Filled 2016-03-21 (×2): qty 1

## 2016-03-21 MED ORDER — POTASSIUM CHLORIDE CRYS ER 20 MEQ PO TBCR
40.0000 meq | EXTENDED_RELEASE_TABLET | Freq: Two times a day (BID) | ORAL | Status: AC
  Administered 2016-03-21 (×2): 40 meq via ORAL
  Filled 2016-03-21 (×2): qty 2

## 2016-03-21 MED ORDER — DEXTROSE 5 % IV SOLN
2.0000 g | INTRAVENOUS | Status: AC
  Administered 2016-03-21: 2 g via INTRAVENOUS
  Filled 2016-03-21: qty 2

## 2016-03-21 NOTE — Progress Notes (Signed)
PROGRESS NOTE    Paige AreolaDaisy Seufert  ZOX:[id number] DOB: 07-Dec-1940 DOA: 03/16/2016 PCP: Ron ParkerBOWEN,SAMUEL, MD    Brief Narrative: Paige Flores is a 75 y.o.  female with medical history significant of HTN, diabetes type 2, and dementia presents with acute encephalopathy.   She was found to have sepsis from  health care associated pneumonia,.    Assessment & Plan:   Active Problems:   Pacemaker   Sepsis (HCC)   HCAP (healthcare-associated pneumonia)   Lactic acidosis   Acute respiratory failure with hypoxia (HCC)   Elevated troponin   Seizure-like activity (HCC)   Acute encephalopathy   Malnutrition of moderate degree   Sepsis from health care associated pneumonia:  Improving parameters, her hypoxia has improved, she is currently on 1 to 2 liters of [place] oxygen. Respiratory panel is positive for rhino virus.  Started on broad spectrum antibiotics. Completed 5 days of cefepime the last dose tonight after which we will change to levaquin for 2 more days. SLP evaluation recommending dysphagia 3 diet.   Improving wbc count. Normal white count this am.  Lactic acid normalized.    Acute respiratory failure with hypoxia: From health care associated pneumonia,  Improving, wean off the oxygen slowly.   Malnutrition : moderate, get SLP eval.    Acute encephalopathy: - possibly infectious vs from medications . - she is more alert but confused,  - she appears to be at baseline.   Elevated troponins: Demand ischemia from sepsis.  Echocardiogram done shows Vigorous LV systolic function; moderate LVH; grade 1 diastolic   dysfunction; calcified aortic valve with moderate AS ; trace AI; mild MR; mild LAE.   Hypokalemia: repelte as needed. Repeat in am shows 3.2,  further supplementation added. .   Normocytic anemia:  Stable around 10.  Monitor.   Moderate malnutrition: Dietician consulted.   DVT prophylaxis: (Lovenox/ Code Status: DNR.  Family Communication:  None at bedside.  Discussed with daughter on Thursday.  Disposition Plan: plan for d/c  On Monday.    Consultants:   None    Procedures: none.    Antimicrobials: cefepime 10/24   Subjective: Appear to be comfortable. As per daughter pt had erky movements while at SNF, none since admission.    Objective: Vitals:   03/20/16 1407 03/20/16 2216 03/21/16 0437 03/21/16 1500  BP: 133/79 136/89 (!) 152/93 (!) 138/92  Pulse: 95 91 98 92  Resp: 17 18 16 20   Temp: 98.4 F (36.9 C) 99.4 F (37.4 C) 98 F (36.7 C) 98 F (36.7 C)  TempSrc: Axillary Oral Oral Core (Comment)  SpO2: 100% 96% 91% 92%  Weight:      Height:        Intake/Output Summary (Last 24 hours) at 03/21/16 1908 Last data filed at 03/21/16 1700  Gross per 24 hour  Intake              680 ml  Output                0 ml  Net              680 ml   Filed Weights   03/17/16 0310 03/18/16 0411  Weight: 47.4 kg (104 lb 8 oz) 50.7 kg (111 lb 12.4 oz)    Examination:  General exam: Appears calm and comfortable  Respiratory system: Clear to auscultation. Respiratory effort normal. Cardiovascular system: S1 & S2 heard, RRR. No JVD, murmurs, rubs, gallops or clicks. Pedal edema.  Gastrointestinal system: Abdomen is  nondistended, soft and nontender. No organomegaly or masses felt. Normal bowel sounds heard. Central nervous system: alert but confused.  Extremities: Symmetric 5 x 5 power. Skin: No rashes, lesions or ulcers     Data Reviewed: I have personally reviewed following labs and imaging studies  CBC:  Recent Labs Lab 03/16/16 2301 03/17/16 0555 03/19/16 0605 03/20/16 0553  WBC 19.8* 17.8* 10.6* 7.9  NEUTROABS 14.8*  --   --   --   HGB 13.2 12.8 10.4* 10.4*  HCT 41.8 38.7 31.2* 32.0*  MCV 96.1 93.7 90.7 94.4  PLT 265 208 141* 145*   Basic Metabolic Panel:  Recent Labs Lab 03/17/16 0555 03/18/16 0314 03/19/16 0605 03/19/16 1903 03/20/16 0553 03/21/16 0553  NA 142 142 143  --  143 142  K 3.4* 3.3* 2.7*  3.2* 3.3* 3.2*  CL 110 114* 112*  --  112* 110  CO2 22 19* 22  --  25 26  GLUCOSE 133* 124* 111*  --  106* 101*  BUN 14 8 6   --  7 6  CREATININE 0.64 0.61 0.54  --  0.58 0.57  CALCIUM 8.6* 8.7* 8.2*  --  8.3* 8.5*  MG  --  1.8 1.7  --   --   --    GFR: Estimated Creatinine Clearance: 41.7 mL/min (by C-G formula based on SCr of 0.57 mg/dL). Liver Function Tests:  Recent Labs Lab 03/16/16 2301 03/17/16 0555  AST 39 39  ALT 33 32  ALKPHOS 109 81  BILITOT 0.7 0.5  PROT 7.2 6.3*  ALBUMIN 3.7 3.2*   No results for input(s): LIPASE, AMYLASE in the last 168 hours.  Recent Labs Lab 03/16/16 2256 03/17/16 0838 03/18/16 0314  AMMONIA 64* 19 40*   Coagulation Profile:  Recent Labs Lab 03/16/16 2301 03/17/16 0144  INR 1.07 1.04   Cardiac Enzymes:  Recent Labs Lab 03/16/16 2301 03/17/16 0135 03/17/16 0555 03/17/16 1159 03/17/16 1821  CKTOTAL  --  174  --   --   --   TROPONINI 0.46*  --  0.07* 0.19* 0.40*   BNP (last 3 results) No results for input(s): PROBNP in the last 8760 hours. HbA1C: No results for input(s): HGBA1C in the last 72 hours. CBG:  Recent Labs Lab 03/21/16 0223 03/21/16 0410 03/21/16 0741 03/21/16 1150 03/21/16 1642  GLUCAP 123* 103* 91 111* 83   Lipid Profile: No results for input(s): CHOL, HDL, LDLCALC, TRIG, CHOLHDL, LDLDIRECT in the last 72 hours. Thyroid Function Tests: No results for input(s): TSH, T4TOTAL, FREET4, T3FREE, THYROIDAB in the last 72 hours. Anemia Panel: No results for input(s): VITAMINB12, FOLATE, FERRITIN, TIBC, IRON, RETICCTPCT in the last 72 hours. Sepsis Labs:  Recent Labs Lab 03/17/16 0144 03/17/16 0153 03/17/16 0555 03/17/16 0838 03/18/16 0933  PROCALCITON <0.10  --   --   --   --   LATICACIDVEN  --  2.29* 3.2* 2.0* 1.7    Recent Results (from the past 240 hour(s))  Culture, blood (x 2)     Status: None (Preliminary result)   Collection Time: 03/16/16 11:00 PM  Result Value Ref Range Status    Specimen Description BLOOD LEFT ANTECUBITAL  Final   Special Requests BOTTLES DRAWN AEROBIC AND ANAEROBIC 5CC EACH  Final   Culture   Final    NO GROWTH 4 DAYS Performed at [facility]    Report Status PENDING  Incomplete  Culture, blood (x 2)     Status: None (Preliminary result)   Collection  Time: 03/17/16  1:35 AM  Result Value Ref Range Status   Specimen Description BLOOD RIGHT ANTECUBITAL  Final   Special Requests BOTTLES DRAWN AEROBIC AND ANAEROBIC 5CC EACH  Final   Culture   Final    NO GROWTH 4 DAYS Performed at [facility]    Report Status PENDING  Incomplete  Respiratory Panel by PCR     Status: Abnormal   Collection Time: 03/17/16  3:46 AM  Result Value Ref Range Status   Adenovirus NOT DETECTED NOT DETECTED Final   Coronavirus 229E NOT DETECTED NOT DETECTED Final   Coronavirus HKU1 NOT DETECTED NOT DETECTED Final   Coronavirus NL63 NOT DETECTED NOT DETECTED Final   Coronavirus OC43 NOT DETECTED NOT DETECTED Final   Metapneumovirus NOT DETECTED NOT DETECTED Final   Rhinovirus / Enterovirus DETECTED (A) NOT DETECTED Final   Influenza A NOT DETECTED NOT DETECTED Final   Influenza B NOT DETECTED NOT DETECTED Final   Parainfluenza Virus 1 NOT DETECTED NOT DETECTED Final   Parainfluenza Virus 2 NOT DETECTED NOT DETECTED Final   Parainfluenza Virus 3 NOT DETECTED NOT DETECTED Final   Parainfluenza Virus 4 NOT DETECTED NOT DETECTED Final   Respiratory Syncytial Virus NOT DETECTED NOT DETECTED Final   Bordetella pertussis NOT DETECTED NOT DETECTED Final   Chlamydophila pneumoniae NOT DETECTED NOT DETECTED Final   Mycoplasma pneumoniae NOT DETECTED NOT DETECTED Final    Comment: Performed at [facility]  MRSA PCR Screening     Status: None   Collection Time: 03/17/16  3:47 AM  Result Value Ref Range Status   MRSA by PCR NEGATIVE NEGATIVE Final    Comment:        The GeneXpert MRSA Assay (FDA approved for NASAL specimens only), is one component of  a comprehensive MRSA colonization surveillance program. It is not intended to diagnose MRSA infection nor to guide or monitor treatment for MRSA infections.          Radiology Studies: No results found.      Scheduled Meds: . ceFEPime (MAXIPIME) IV  2 g Intravenous Q24H  . famotidine (PEPCID) IV  20 mg Intravenous Q24H  . insulin aspart  0-9 Units Subcutaneous Q4H  . mouth rinse  15 mL Mouth Rinse BID  . potassium chloride  40 mEq Oral BID   Continuous Infusions: . sodium chloride 100 mL/hr at 03/21/16 1615     LOS: 4 days    Time spent: 25 minutes.     Kathlen ModyAKULA,Clete Kuch, MD [facility] Pager [phone]  If 7PM-7AM, please contact night-coverage [email or web address] Password [email or web address] 03/21/2016, 7:08 PM

## 2016-03-22 LAB — CULTURE, BLOOD (ROUTINE X 2)
CULTURE: NO GROWTH
CULTURE: NO GROWTH

## 2016-03-22 LAB — BASIC METABOLIC PANEL
ANION GAP: 6 (ref 5–15)
BUN: <5 mg/dL — ABNORMAL LOW (ref 6–20)
CALCIUM: 8.9 mg/dL (ref 8.9–10.3)
CHLORIDE: 108 mmol/L (ref 101–111)
CO2: 26 mmol/L (ref 22–32)
Creatinine, Ser: 0.41 mg/dL — ABNORMAL LOW (ref 0.44–1.00)
GFR calc non Af Amer: >60 mL/min (ref >60)
Glucose, Bld: 128 mg/dL — ABNORMAL HIGH (ref 65–99)
Potassium: 3.3 mmol/L — ABNORMAL LOW (ref 3.5–5.1)
Sodium: 140 mmol/L (ref 135–145)

## 2016-03-22 LAB — GLUCOSE, CAPILLARY
GLUCOSE-CAPILLARY: 126 mg/dL — AB (ref 65–99)
GLUCOSE-CAPILLARY: 109 mg/dL — AB (ref 65–99)
GLUCOSE-CAPILLARY: 129 mg/dL — AB (ref 65–99)
GLUCOSE-CAPILLARY: 97 mg/dL (ref 65–99)
Glucose-Capillary: 105 mg/dL — ABNORMAL HIGH (ref 65–99)
Glucose-Capillary: 115 mg/dL — ABNORMAL HIGH (ref 65–99)

## 2016-03-22 LAB — MAGNESIUM: Magnesium: 1.5 mg/dL — ABNORMAL LOW (ref 1.7–2.4)

## 2016-03-22 MED ORDER — DEXTROSE 5 % IV SOLN
3.0000 g | Freq: Once | INTRAVENOUS | Status: AC
  Administered 2016-03-22: 3 g via INTRAVENOUS
  Filled 2016-03-22: qty 6

## 2016-03-22 MED ORDER — POTASSIUM CHLORIDE CRYS ER 20 MEQ PO TBCR
40.0000 meq | EXTENDED_RELEASE_TABLET | Freq: Three times a day (TID) | ORAL | Status: AC
  Administered 2016-03-22 (×3): 40 meq via ORAL
  Filled 2016-03-22 (×3): qty 2

## 2016-03-22 NOTE — Progress Notes (Signed)
PROGRESS NOTE    Paige Flores  ZOX:[id number] DOB: 09-13-1940 DOA: 03/16/2016 PCP: Ron Parker, MD    Brief Narrative: Paige Flores is a 75 y.o.  female with medical history significant of HTN, diabetes type 2, and dementia presents with acute encephalopathy.   She was found to have sepsis from  health care associated pneumonia,.    Assessment & Plan:   Active Problems:   Pacemaker   Sepsis (HCC)   HCAP (healthcare-associated pneumonia)   Lactic acidosis   Acute respiratory failure with hypoxia (HCC)   Elevated troponin   Seizure-like activity (HCC)   Acute encephalopathy   Malnutrition of moderate degree   Sepsis from health care associated pneumonia:  RESOLVED, her hypoxia has improved, she is off oxygen. Respiratory panel is positive for rhino virus.  Started on broad spectrum antibiotics. Completed 5 days of cefepime , will be followed with levaquin for 2 more days. D/c antibiotics in am on discharge.  SLP evaluation recommending dysphagia 3 diet.   Improving wbc count. Normal white count this am.  Lactic acid normalized.    Acute respiratory failure with hypoxia: From health care associated pneumonia,  Improving, weaned off oxygen.   Malnutrition : moderate, SLP and nutrition consulted.    Acute encephalopathy: - possibly infectious vs from medications . - she is more alert but confused,  - she appears to be at baseline.   Elevated troponins: Demand ischemia from sepsis.  Echocardiogram done shows Vigorous LV systolic function; moderate LVH; grade 1 diastolic   dysfunction; calcified aortic valve with moderate AS ; trace AI; mild MR; mild LAE.   Hypokalemia: repelte as needed. Repeat in am shows 3.3  further supplementation added. .   Normocytic anemia:  Stable around 10.  Monitor.   Moderate malnutrition: Dietician consulted.    Hypomagnesemia: repleted.    DVT prophylaxis: (Lovenox/ Code Status: DNR.  Family Communication:  Discussed with  daughter at bedside.  Disposition Plan: plan for d/c  On Monday.    Consultants:   None    Procedures: none.    Antimicrobials: cefepime 10/24 till 10/28. levaquin from 10/29   Subjective: Appear to be comfortable. As per daughter pt had erky movements while at SNF, none since admission.    Objective: Vitals:   03/21/16 1500 03/21/16 2056 03/22/16 0513 03/22/16 1300  BP: (!) 138/92 (!) 150/89 (!) 156/100 94/60  Pulse: 92 95 88 93  Resp: 20 (!) 21 20 18   Temp: 98 F (36.7 C) 99.6 F (37.6 C)  97.3 F (36.3 C)  TempSrc: Core (Comment) Axillary  Axillary  SpO2: 92% 90% 95% 94%  Weight:      Height:        Intake/Output Summary (Last 24 hours) at 03/22/16 1647 Last data filed at 03/22/16 1411  Gross per 24 hour  Intake              440 ml  Output                0 ml  Net              440 ml   Filed Weights   03/17/16 0310 03/18/16 0411  Weight: 47.4 kg (104 lb 8 oz) 50.7 kg (111 lb 12.4 oz)    Examination:  General exam: Appears calm and comfortable  Respiratory system: Clear to auscultation. Respiratory effort normal. Cardiovascular system: S1 & S2 heard, RRR. No JVD, murmurs, rubs, gallops or clicks. Pedal edema.  Gastrointestinal system: Abdomen is  nondistended, soft and nontender. No organomegaly or masses felt. Normal bowel sounds heard. Central nervous system: alert but confused.  Extremities: Symmetric 5 x 5 power. Skin: No rashes, lesions or ulcers     Data Reviewed: I have personally reviewed following labs and imaging studies  CBC:  Recent Labs Lab 03/16/16 2301 03/17/16 0555 03/19/16 0605 03/20/16 0553  WBC 19.8* 17.8* 10.6* 7.9  NEUTROABS 14.8*  --   --   --   HGB 13.2 12.8 10.4* 10.4*  HCT 41.8 38.7 31.2* 32.0*  MCV 96.1 93.7 90.7 94.4  PLT 265 208 141* 145*   Basic Metabolic Panel:  Recent Labs Lab 03/18/16 0314 03/19/16 0605 03/19/16 1903 03/20/16 0553 03/21/16 0553 03/22/16 0503  NA 142 143  --  143 142 140  K 3.3*  2.7* 3.2* 3.3* 3.2* 3.3*  CL 114* 112*  --  112* 110 108  CO2 19* 22  --  25 26 26   GLUCOSE 124* 111*  --  106* 101* 128*  BUN 8 6  --  7 6 <5*  CREATININE 0.61 0.54  --  0.58 0.57 0.41*  CALCIUM 8.7* 8.2*  --  8.3* 8.5* 8.9  MG 1.8 1.7  --   --   --  1.5*   GFR: Estimated Creatinine Clearance: 41.7 mL/min (by C-G formula based on SCr of 0.41 mg/dL (L)). Liver Function Tests:  Recent Labs Lab 03/16/16 2301 03/17/16 0555  AST 39 39  ALT 33 32  ALKPHOS 109 81  BILITOT 0.7 0.5  PROT 7.2 6.3*  ALBUMIN 3.7 3.2*   No results for input(s): LIPASE, AMYLASE in the last 168 hours.  Recent Labs Lab 03/16/16 2256 03/17/16 0838 03/18/16 0314  AMMONIA 64* 19 40*   Coagulation Profile:  Recent Labs Lab 03/16/16 2301 03/17/16 0144  INR 1.07 1.04   Cardiac Enzymes:  Recent Labs Lab 03/16/16 2301 03/17/16 0135 03/17/16 0555 03/17/16 1159 03/17/16 1821  CKTOTAL  --  174  --   --   --   TROPONINI 0.46*  --  0.07* 0.19* 0.40*   BNP (last 3 results) No results for input(s): PROBNP in the last 8760 hours. HbA1C: No results for input(s): HGBA1C in the last 72 hours. CBG:  Recent Labs Lab 03/22/16 0023 03/22/16 0416 03/22/16 0748 03/22/16 1150 03/22/16 1630  GLUCAP 105* 126* 109* 115* 129*   Lipid Profile: No results for input(s): CHOL, HDL, LDLCALC, TRIG, CHOLHDL, LDLDIRECT in the last 72 hours. Thyroid Function Tests: No results for input(s): TSH, T4TOTAL, FREET4, T3FREE, THYROIDAB in the last 72 hours. Anemia Panel: No results for input(s): VITAMINB12, FOLATE, FERRITIN, TIBC, IRON, RETICCTPCT in the last 72 hours. Sepsis Labs:  Recent Labs Lab 03/17/16 0144 03/17/16 0153 03/17/16 0555 03/17/16 0838 03/18/16 0933  PROCALCITON <0.10  --   --   --   --   LATICACIDVEN  --  2.29* 3.2* 2.0* 1.7    Recent Results (from the past 240 hour(s))  Culture, blood (x 2)     Status: None   Collection Time: 03/16/16 11:00 PM  Result Value Ref Range Status    Specimen Description BLOOD LEFT ANTECUBITAL  Final   Special Requests BOTTLES DRAWN AEROBIC AND ANAEROBIC [facility] EACH  Final   Culture   Final    NO GROWTH 5 DAYS [facility]formed [facility]    Report Status 03/22/2016 FINAL  Final  Culture, blood (x 2)     Status: None   Collection Time: 03/17/16  1:35  AM  Result Value Ref Range Status   Specimen Description BLOOD RIGHT ANTECUBITAL  Final   Special Requests BOTTLES DRAWN AEROBIC AND ANAEROBIC 5CC EACH  Final   Culture   Final    NO GROWTH 5 DAYS [facility]formed at [facility]    Report Status 03/22/2016 FINAL  Final  Respiratory Panel by PCR     Status: Abnormal   Collection Time: 03/17/16  3:46 AM  Result Value Ref Range Status   Adenovirus NOT DETECTED NOT DETECTED Final   Coronavirus 229E NOT DETECTED NOT DETECTED Final   Coronavirus HKU1 NOT DETECTED NOT DETECTED Final   Coronavirus NL63 NOT DETECTED NOT DETECTED Final   Coronavirus OC43 NOT DETECTED NOT DETECTED Final   Metapneumovirus NOT DETECTED NOT DETECTED Final   Rhinovirus / Enterovirus DETECTED (A) NOT DETECTED Final   Influenza A NOT DETECTED NOT DETECTED Final   Influenza B NOT DETECTED NOT DETECTED Final   Parainfluenza Virus 1 NOT DETECTED NOT DETECTED Final   Parainfluenza Virus 2 NOT DETECTED NOT DETECTED Final   Parainfluenza Virus 3 NOT DETECTED NOT DETECTED Final   Parainfluenza Virus 4 NOT DETECTED NOT DETECTED Final   Respiratory Syncytial Virus NOT DETECTED NOT DETECTED Final   Bordetella pertussis NOT DETECTED NOT DETECTED Final   Chlamydophila pneumoniae NOT DETECTED NOT DETECTED Final   Mycoplasma pneumoniae NOT DETECTED NOT DETECTED Final    Comment: Performed at [facility]  MRSA PCR Screening     Status: None   Collection Time: 03/17/16  3:47 AM  Result Value Ref Range Status   MRSA by PCR NEGATIVE NEGATIVE Final    Comment:        The GeneXpert MRSA Assay (FDA approved for NASAL specimens only), is one component of  a comprehensive MRSA colonization surveillance program. It is not intended to diagnose MRSA infection nor to guide or monitor treatment for MRSA infections.          Radiology Studies: No results found.      Scheduled Meds: . insulin aspart  0-9 Units Subcutaneous Q4H  . mouth rinse  15 mL Mouth Rinse BID  . potassium chloride  40 mEq Oral TID   Continuous Infusions:     LOS: 5 days    Time spent: 25 minutes.     Kathlen ModyAKULA,Mani Celestin, MD [facility] Pager [phone]  If 7PM-7AM, please contact night-coverage [email or web address] Password [email or web address] 03/22/2016, 4:47 PM

## 2016-03-23 LAB — GLUCOSE, CAPILLARY
GLUCOSE-CAPILLARY: 92 mg/dL (ref 65–99)
GLUCOSE-CAPILLARY: 157 mg/dL — AB (ref 65–99)
GLUCOSE-CAPILLARY: 89 mg/dL (ref 65–99)
Glucose-Capillary: 118 mg/dL — ABNORMAL HIGH (ref 65–99)
Glucose-Capillary: 133 mg/dL — ABNORMAL HIGH (ref 65–99)

## 2016-03-23 LAB — BASIC METABOLIC PANEL
ANION GAP: 8 (ref 5–15)
BUN: 8 mg/dL (ref 6–20)
CHLORIDE: 105 mmol/L (ref 101–111)
CO2: 26 mmol/L (ref 22–32)
Calcium: 9 mg/dL (ref 8.9–10.3)
Creatinine, Ser: 0.66 mg/dL (ref 0.44–1.00)
Glucose, Bld: 171 mg/dL — ABNORMAL HIGH (ref 65–99)
POTASSIUM: 3.9 mmol/L (ref 3.5–5.1)
SODIUM: 139 mmol/L (ref 135–145)

## 2016-03-23 LAB — MAGNESIUM: MAGNESIUM: 1.8 mg/dL (ref 1.7–2.4)

## 2016-03-23 NOTE — Progress Notes (Signed)
Gave report to Morrie SheldonAshley, Charity fundraiserN at [facility]. Left number in case she had additional questions.

## 2016-03-23 NOTE — Progress Notes (Signed)
[facility] called for transport.  Nurse Given Report

## 2016-03-23 NOTE — Progress Notes (Signed)
ALF is reviewing clinicals. Will call CSW when ready to admit.

## 2016-03-23 NOTE — Clinical Social Work Placement (Addendum)
   CLINICAL SOCIAL WORK PLACEMENT  NOTE  Date:  03/23/2016  Patient Details  Name: Paige AreolaDaisy Crispen MRN: [id number] Date of Birth: 14-Feb-1941  Clinical Social Work is seeking post-discharge placement for this patient at the Assisted Living Facility level of care (*CSW will initial, date and re-position this form in  chart as items are completed):  No   Patient/family provided with [facility] Clinical Social Work Department's list of facilities offering this level of care within the geographic area requested by the patient (or if unable, by the patient's family).  No   Patient/family informed of their freedom to choose among providers that offer the needed level of care, that participate in Medicare, Medicaid or managed care program needed by the patient, have an available bed and are willing to accept the patient.  No   Patient/family informed of [facility]'s ownership interest in [facility] and [facility], as well as of the fact that they are under no obligation to receive care at these facilities.  PASRR submitted to EDS on       PASRR number received on       Existing PASRR number confirmed on       FL2 transmitted to all facilities in geographic area requested by pt/family on       FL2 transmitted to all facilities within larger geographic area on       Patient informed that his/her managed care company has contracts with or will negotiate with certain facilities, including the following:  [facility]         Patient/family informed of bed offers received.  [facility]     Physician recommends and patient [facility]    [facility] to [facility] on 03/23/16.  Patient to be transferred to facility by PT[facility]     Patient family notified on   of transfer.03/23/2016  Name of family member notified:    Daughter     PHYSICIAN Please sign DNR, Please prepare priority discharge summary, including  medications     Additional Comment:   [facility] _______________________________________________ Clearance CootsNicole A [place] Throckmorton, LCSW 03/23/2016, 11:58 AM

## 2016-03-23 NOTE — Progress Notes (Signed)
Nutrition Follow-up  DOCUMENTATION CODES:   Non-severe (moderate) malnutrition in context of chronic illness  INTERVENTION:  - Continue Dysphagia 3, thin liquids per SLP recommendation. - Continue to encourage PO intakes. - RD will monitor for needs if pt unable to d/c today.  NUTRITION DIAGNOSIS:   Inadequate oral intake related to inability to eat as evidenced by NPO status. -resolved with diet advancement and current PO intakes.  GOAL:   Patient will meet greater than or equal to 90% of their needs -met  MONITOR:   PO intake, Weight trends, Labs, I & O's  ASSESSMENT:   75 y.o.  female with medical history significant of HTN, diabetes type 2, and dementia; who presents after being found by staff acutely altered. History is obtained by one of the patient's nursing home caregivers and daughter who is present at bedside as the patient has dementia and is acutely altered. Patient lives in [place] and at baseline is nonverbal and will intermittently smile. Caregiver notes that she was not taking any of her medications today and was found acutely foaming at the mouth with rigid posture with hands curled up together as though she were having a seizure. No previous history of seizure or requiring oxygen to maintain O2 sats. She is noted to intermittently grunt and intermittently jerk. Patient has had the intermittent jerking for over a year but it had recently gotten worse in the last few months. She had been recently started on gabapentin. Patient had last fallen out of bed thought to be related to these jerks and had come to the ED multiple times for falls last noted to come on 10/21. At that time family noted that there was no signs of infection or issues from lab work done.   10/30 Diet advanced from NPO to Dysphagia 3, thin liquids in the afternoon on 10/26. Since that time, pt has been consuming 30-75% of meals (mainly >/= 50%). D/c summary and order in place at this time. If pt is  unable to d/c, will monitor for additional nutrition-related needs. No new weight since 10/25.  Medications reviewed; sliding scale Novolog, 3 g IV Mg sulfate x1 dose yesterday, PRN Zofran, 40 mEq oral KCl TID.  Labs reviewed; CBGs: 92-157 mg/dL today.   10/26 - Pt remains NPO since admission.  - No new weight since admission.  - Per MD note yesterday AM, plan for SLP evaluation with associated possible diet advancement. - If diet unable to be advanced and if within POC/GOC, TF recommendations outlined.   IVF: NS @ 100 mL/hr.    10/24 - Pt has been NPO since admission.  - Per rounds, pt is nonverbal at baseline; no family/visitors present to provide PTA information.  - Also per rounds, pt with 2 recent admissions for falls.   - Physical assessment shows mild muscle and fat wasting to upper body and mild to moderate muscle wasting to lower body.  - No edema present at this time.  - Only other weight available at this time is from 02/01/16 and shows weight of 130 lbs; question accuracy of this based on information available in notes from PTA while at [facility].    IVF:NS @ 100 mL/hr.  ADDENDUM: Daughter reports that pt was not eating for unknown period of time PTA as she was not awake/alert enough to do so; she was informed of this by facility staff PTA. Daughter then requested that pt be provided with protein shakes as she felt that pt appeared malnourished.  Diet Order:  DIET DYS 3 Room service appropriate? Yes; Fluid consistency: Thin  Skin:  Reviewed, no issues  Last BM:  10/28  Height:   Ht Readings from Last 1 Encounters:  03/17/16 4' 10"  (1.473 m)    Weight:   Wt Readings from Last 1 Encounters:  03/18/16 111 lb 12.4 oz (50.7 kg)    Ideal Body Weight:  42.18 kg  BMI:  Body mass index is 23.36 kg/m.  Estimated Nutritional Needs:   Kcal:  1185-1327 (20-23 kcal/kg)  Protein:  47-57 grams (1-1.2 grams/kg)  Fluid:  >/= 1.2 L/day  EDUCATION NEEDS:    No education needs identified at this time    Jarome Matin, MS, RD, LDN Inpatient Clinical Dietitian Pager # [phone] After hours/weekend pager # [phone]

## 2016-03-23 NOTE — Care Management Important Message (Signed)
Important Message  Patient Details  Name: Paige AreolaDaisy Flores MRN: [id number] Date of Birth: Sep 16, 1940   Medicare Important Message Given:  Yes    Haskell FlirtJamison, Laqueisha Catalina 03/23/2016, 11:37 AMImportant Message  Patient Details  Name: Paige AreolaDaisy Flores MRN: [id number] Date of Birth: Sep 16, 1940   Medicare Important Message Given:  Yes    Haskell FlirtJamison, Natane Heward 03/23/2016, 11:37 AM

## 2016-03-23 NOTE — Discharge Summary (Addendum)
Physician Discharge Summary  Edwinna AreolaDaisy Imhof JXB:[id number] DOB: [birth year] DOA: 03/16/2016  PCP: Ron ParkerBOWEN,SAMUEL, MD  Admit date: 03/16/2016 Discharge date: 03/23/2016  Admitted From:  ALF Disposition: ALF  Recommendations for Outpatient Follow-up:  1. Follow up with PCP in 1-2 weeks 2. Please obtain BMP/CBC in one week 3. Please follow up WITH NEUROLOGY in 1 to 2 weeks for evaluation of involuntary  Jerky movement.     Discharge Condition: stable.  CODE STATUS:DNR Diet recommendation: Dysphagia 3 DIET with thin liquids, please feed with assistance.   Brief/Interim Summary: Bertrum SolDaisy Fontaineis a 75 y.o.femalewith medical history significant of HTN, diabetes type 2, and dementia presents with acute encephalopathy.   She was found to have sepsis from  health care associated pneumonia,.   Discharge Diagnoses:  Active Problems:   Pacemaker   Sepsis (HCC)   HCAP (healthcare-associated pneumonia)   Lactic acidosis   Acute respiratory failure with hypoxia (HCC)   Elevated troponin   Seizure-like activity (HCC)   Acute encephalopathy   Malnutrition of moderate degree  Sepsis from health care associated pneumonia:  RESOLVED, her hypoxia has improved, she is off oxygen. Respiratory panel is positive for rhino virus.  Started on broad spectrum antibiotics. Completed 5 days of cefepime , will be followed with levaquin for 2 more days. D/c antibiotics on discharge.  SLP evaluation recommending dysphagia 3 diet.    Normal white count this am.  Lactic acid normalized.    Acute respiratory failure with hypoxia: From health care associated pneumonia,  Improving, weaned off oxygen.   Malnutrition : moderate, SLP and nutrition consulted.    Acute encephalopathy: - possibly infectious vs from medications . - she is more alert but confused,  - she appears to be at baseline.   Elevated troponins: Demand ischemia from sepsis.  Echocardiogram done shows Vigorous LV systolic  function; moderate LVH; grade 1 diastolic dysfunction; calcified aortic valve with moderate AS ; trace AI; mild MR; mild LAE.   Hypokalemia: repelte as needed. Repeat in am shows 3.3  further supplementation added. .  repeat level normal.   Normocytic anemia:  Stable around 10.  Monitor.   Moderate malnutrition: Dietician consulted AND recommendations gien.    Hypomagnesemia: repleted.   Discharge Instructions  Discharge Instructions    Discharge instructions    Complete by:  As directed    Please follow up with PCP in 2 weeks, post hospitalization visit.  Please follow up with neurology for the involuntary jerky movements in 1 to 2 weeks.       Medication List    STOP taking these medications   gabapentin 300 MG capsule Commonly known as:  NEURONTIN   guaifenesin 100 MG/5ML syrup Commonly known as:  ROBITUSSIN   hydrOXYzine 10 MG tablet Commonly known as:  ATARAX/VISTARIL   loperamide 2 MG capsule Commonly known as:  IMODIUM     TAKE these medications   acetaminophen 500 MG tablet Commonly known as:  TYLENOL Take 500 mg by mouth every 4 (four) hours as needed for mild pain.   alum & mag hydroxide-simeth 400-400-40 MG/5ML suspension Commonly known as:  MAALOX PLUS Take 15 mLs by mouth every 6 (six) hours as needed for indigestion.   cholecalciferol 1000 units tablet Commonly known as:  VITAMIN D Take 2,000 Units by mouth daily.   diltiazem 180 MG 24 hr capsule Commonly known as:  DILACOR XR Take 180 mg by mouth daily.   escitalopram 10 MG tablet Commonly known as:  LEXAPRO Take 10 mg  by mouth daily.   magnesium hydroxide 400 MG/5ML suspension Commonly known as:  MILK OF MAGNESIA Take 30 mLs by mouth at bedtime as needed for mild constipation.   magnesium oxide 400 MG tablet Commonly known as:  MAG-OX Take 400 mg by mouth daily.   Melatonin 5 MG Tabs Take 1 tablet by mouth daily.   neomycin-bacitracin-polymyxin ointment Commonly known  as:  NEOSPORIN Apply 1 application topically as needed for wound care. apply to eye   OVER THE COUNTER MEDICATION Take 1 each by mouth 4 (four) times daily.      Follow-up Information    BOWEN,SAMUEL, MD. Schedule an appointment as soon as possible for a visit in 1 week(s).   Specialty:  Internal Medicine       [facility]. Schedule an appointment as soon as possible for a visit in 1 week(s).   Why:  for involuntary jerky movements,.she was puton gabapentin, which did not help.   Contact information: [address] [place], [address] [place] [address] [phone]         No Known Allergies  Consultations:  none   Procedures/Studies: Ct Head Wo Contrast  Result Date: 03/16/2016 CLINICAL DATA:  Confusion EXAM: CT HEAD WITHOUT CONTRAST TECHNIQUE: Contiguous axial images were obtained from the base of the skull through the vertex without intravenous contrast. COMPARISON:  03/14/2016 CT FINDINGS: Brain: There is mild superficial and marked central atrophy with chronic periventricular white matter small vessel ischemic disease. No acute intraparenchymal hemorrhage, mass effect or midline shift. No acute large vascular territory infarcts. No abnormal extra-axial fluid collection. The basal cisterns are patent. Vascular: Mild atherosclerosis of the carotid siphons. Skull: No acute fracture bone destruction. Sinuses/Orbits: Air-fluid levels in the left frontal and left greater than right maxillary sinuses as before consistent with acute sinusitis. Small left mastoid effusion sclerosis. Orbits are symmetric in appearance. Ocular globes are intact. Other: None IMPRESSION: Marked central atrophy with moderate chronic small vessel ischemic disease of periventricular white matter. No acute intracranial abnormality. Frontal and bilateral maxillary sinusitis with air-fluid levels, findings are unchanged. Small left mastoid effusion also unchanged. Electronically Signed   By:  Tollie Eth M.D.   On: 03/16/2016 23:38   Ct Head Wo Contrast  Result Date: 03/14/2016 CLINICAL DATA:  Found down beside bed on floor mat. No apparent injury. History of dementia, hypertension and diabetes. EXAM: CT HEAD WITHOUT CONTRAST CT CERVICAL SPINE WITHOUT CONTRAST TECHNIQUE: Multidetector CT imaging of the head and cervical spine was performed following the standard protocol without intravenous contrast. Multiplanar CT image reconstructions of the cervical spine were also generated. COMPARISON:  CT HEAD and cervical spine February 29, 2016 FINDINGS: CT HEAD FINDINGS BRAIN: Severe ventriculomegaly on the basis of global parenchymal brain volume loss No intraparenchymal hemorrhage, mass effect nor midline shift. Patchy supratentorial white matter hypodensities within normal range for patient's age, though non-specific are most compatible with chronic small vessel ischemic disease. No acute large vascular territory infarcts. No abnormal extra-axial fluid collections. Basal cisterns are patent. VASCULAR: Mild calcific atherosclerosis of the carotid siphons. SKULL: No skull fracture. Small residual bifrontal scalp hematoma. No subcutaneous gas or radiopaque foreign bodies. SINUSES/ORBITS: Mild paranasal sinus mucosal thickening and scattered air-fluid levels. Small LEFT mastoid effusion. Soft tissue within the bilateral external auditory canals most compatible with cerumen. The included ocular globes and orbital contents are non-suspicious. OTHER: None. CT CERVICAL SPINE FINDINGS ALIGNMENT: Maintained lordosis. Stable grade 1 C4-5 anterolisthesis. SKULL BASE AND VERTEBRAE: Cervical vertebral bodies and  posterior elements are intact. Severe C5-6 through T1-2 disc height loss, endplate spurring compatible with degenerative discs. Moderate C3-4 degenerative disc. Moderate to severe upper cervical facet arthropathy. C1-2 articulation maintained with mild arthropathy. Osteopenia. No destructive bony lesions. SOFT  TISSUES AND SPINAL CANAL: Normal. DISC LEVELS: No significant osseous canal stenosis. Moderate to severe C3-4, moderate C4-5, moderate to severe C5-6 neural foraminal narrowing. UPPER CHEST: Lung apices are clear. OTHER: None. IMPRESSION: CT HEAD: No acute intracranial process. Small residual frontal scalp hematoma. No skull fracture. Stable chronic changes including moderate to severe global brain atrophy and moderate to severe chronic small vessel ischemic disease. CT CERVICAL SPINE: No acute fracture. Stable grade 1 C4-5 anterolisthesis on degenerative basis. Electronically Signed   By: Awilda Metro M.D.   On: 03/14/2016 04:04   Ct Head Wo Contrast  Result Date: 02/29/2016 CLINICAL DATA:  Fall today.  Initial encounter. EXAM: CT HEAD WITHOUT CONTRAST CT CERVICAL SPINE WITHOUT CONTRAST TECHNIQUE: Multidetector CT imaging of the head and cervical spine was performed following the standard protocol without intravenous contrast. Multiplanar CT image reconstructions of the cervical spine were also generated. COMPARISON:  01/22/2016 FINDINGS: CT HEAD FINDINGS Brain: No evidence of acute infarction, hemorrhage, hydrocephalus, extra-axial collection or mass lesion/mass effect. The brain shows stable global atrophy and underlying small vessel disease with old lacunar infarcts. Vascular: No hyperdense vessel or unexpected calcification. Skull: Normal. Negative for fracture or focal lesion. Suggestion of mild soft tissue swelling in the right frontal region. Sinuses/Orbits: No acute finding. CT CERVICAL SPINE FINDINGS Alignment: Stable degenerative anterolisthesis of C4 on C5. Skull base and vertebrae: No acute fracture. No primary bone lesion or focal pathologic process. Soft tissues and spinal canal: No prevertebral fluid or swelling. No visible canal hematoma. Disc levels: Stable advanced spondylosis throughout the cervical spine, most prominently at C5-6, C6-7 and C7-T1. Upper chest: Negative. IMPRESSION: No  acute injuries identified in the head or cervical spine. Electronically Signed   By: Irish Lack M.D.   On: 02/29/2016 11:01   Ct Cervical Spine Wo Contrast  Result Date: 03/14/2016 CLINICAL DATA:  Found down beside bed on floor mat. No apparent injury. History of dementia, hypertension and diabetes. EXAM: CT HEAD WITHOUT CONTRAST CT CERVICAL SPINE WITHOUT CONTRAST TECHNIQUE: Multidetector CT imaging of the head and cervical spine was performed following the standard protocol without intravenous contrast. Multiplanar CT image reconstructions of the cervical spine were also generated. COMPARISON:  CT HEAD and cervical spine February 29, 2016 FINDINGS: CT HEAD FINDINGS BRAIN: Severe ventriculomegaly on the basis of global parenchymal brain volume loss No intraparenchymal hemorrhage, mass effect nor midline shift. Patchy supratentorial white matter hypodensities within normal range for patient's age, though non-specific are most compatible with chronic small vessel ischemic disease. No acute large vascular territory infarcts. No abnormal extra-axial fluid collections. Basal cisterns are patent. VASCULAR: Mild calcific atherosclerosis of the carotid siphons. SKULL: No skull fracture. Small residual bifrontal scalp hematoma. No subcutaneous gas or radiopaque foreign bodies. SINUSES/ORBITS: Mild paranasal sinus mucosal thickening and scattered air-fluid levels. Small LEFT mastoid effusion. Soft tissue within the bilateral external auditory canals most compatible with cerumen. The included ocular globes and orbital contents are non-suspicious. OTHER: None. CT CERVICAL SPINE FINDINGS ALIGNMENT: Maintained lordosis. Stable grade 1 C4-5 anterolisthesis. SKULL BASE AND VERTEBRAE: Cervical vertebral bodies and posterior elements are intact. Severe C5-6 through T1-2 disc height loss, endplate spurring compatible with degenerative discs. Moderate C3-4 degenerative disc. Moderate to severe upper cervical facet arthropathy.  C1-2 articulation maintained  with mild arthropathy. Osteopenia. No destructive bony lesions. SOFT TISSUES AND SPINAL CANAL: Normal. DISC LEVELS: No significant osseous canal stenosis. Moderate to severe C3-4, moderate C4-5, moderate to severe C5-6 neural foraminal narrowing. UPPER CHEST: Lung apices are clear. OTHER: None. IMPRESSION: CT HEAD: No acute intracranial process. Small residual frontal scalp hematoma. No skull fracture. Stable chronic changes including moderate to severe global brain atrophy and moderate to severe chronic small vessel ischemic disease. CT CERVICAL SPINE: No acute fracture. Stable grade 1 C4-5 anterolisthesis on degenerative basis. Electronically Signed   By: Awilda Metroourtnay  Bloomer M.D.   On: 03/14/2016 04:04   Ct Cervical Spine Wo Contrast  Result Date: 02/29/2016 CLINICAL DATA:  Fall today.  Initial encounter. EXAM: CT HEAD WITHOUT CONTRAST CT CERVICAL SPINE WITHOUT CONTRAST TECHNIQUE: Multidetector CT imaging of the head and cervical spine was performed following the standard protocol without intravenous contrast. Multiplanar CT image reconstructions of the cervical spine were also generated. COMPARISON:  01/22/2016 FINDINGS: CT HEAD FINDINGS Brain: No evidence of acute infarction, hemorrhage, hydrocephalus, extra-axial collection or mass lesion/mass effect. The brain shows stable global atrophy and underlying small vessel disease with old lacunar infarcts. Vascular: No hyperdense vessel or unexpected calcification. Skull: Normal. Negative for fracture or focal lesion. Suggestion of mild soft tissue swelling in the right frontal region. Sinuses/Orbits: No acute finding. CT CERVICAL SPINE FINDINGS Alignment: Stable degenerative anterolisthesis of C4 on C5. Skull base and vertebrae: No acute fracture. No primary bone lesion or focal pathologic process. Soft tissues and spinal canal: No prevertebral fluid or swelling. No visible canal hematoma. Disc levels: Stable advanced spondylosis  throughout the cervical spine, most prominently at C5-6, C6-7 and C7-T1. Upper chest: Negative. IMPRESSION: No acute injuries identified in the head or cervical spine. Electronically Signed   By: Irish LackGlenn  Yamagata M.D.   On: 02/29/2016 11:01   Dg Chest Port 1 View  Result Date: 03/16/2016 CLINICAL DATA:  75 year old female with altered mental status and shortness of breath EXAM: PORTABLE CHEST 1 VIEW COMPARISON:  None. FINDINGS: There is shallow inspiration. Patchy area of increased density at the left lung base, likely atelectatic changes versus infiltrate. There is no pleural effusion or pneumothorax. Top-normal cardiac size. The aorta is tortuous. Left pectoral pacemaker device. There is osteopenia with degenerative changes of the spine. No acute fracture. IMPRESSION: Left lung base atelectasis versus infiltrate. Clinical correlation is recommended. Electronically Signed   By: Elgie CollardArash  Radparvar M.D.   On: 03/16/2016 23:35       Subjective:  Appears comfortable.  Discharge Exam: Vitals:   03/22/16 2055 03/23/16 0600  BP: 140/87 (!) 138/105  Pulse: 97 92  Resp: 18 18  Temp: 99.8 F (37.7 C) 97.5 F (36.4 C)   Vitals:   03/22/16 0513 03/22/16 1300 03/22/16 2055 03/23/16 0600  BP: (!) 156/100 94/60 140/87 (!) 138/105  Pulse: 88 93 97 92  Resp: 20 18 18 18   Temp:  97.3 F (36.3 C) 99.8 F (37.7 C) 97.5 F (36.4 C)  TempSrc:  Axillary Axillary Axillary  SpO2: 95% 94% 100% 96%  Weight:      Height:        General: Pt is alert, awake, not in acute distress Cardiovascular: RRR, S1/S2 +, no rubs, no gallops Respiratory: CTA bilaterally, no wheezing, no rhonchi Abdominal: Soft, NT, ND, bowel sounds + Extremities: no edema, no cyanosis    The results of significant diagnostics from this hospitalization (including imaging, microbiology, ancillary and laboratory) are listed below for reference.  Microbiology: Recent Results (from the past 240 hour(s))  Culture, blood (x 2)      Status: None   Collection Time: 03/16/16 11:00 PM  Result Value Ref Range Status   Specimen Description BLOOD LEFT ANTECUBITAL  Final   Special Requests BOTTLES DRAWN AEROBIC AND ANAEROBIC 5CC EACH  Final   Culture   Final    NO GROWTH 5 DAYS Performed at [facility]    Report Status 03/22/2016 FINAL  Final  Culture, blood (x 2)     Status: None   Collection Time: 03/17/16  1:35 AM  Result Value Ref Range Status   Specimen Description BLOOD RIGHT ANTECUBITAL  Final   Special Requests BOTTLES DRAWN AEROBIC AND ANAEROBIC 5CC EACH  Final   Culture   Final    NO GROWTH 5 DAYS Performed at [facility]    Report Status 03/22/2016 FINAL  Final  Respiratory Panel by PCR     Status: Abnormal   Collection Time: 03/17/16  3:46 AM  Result Value Ref Range Status   Adenovirus NOT DETECTED NOT DETECTED Final   Coronavirus 229E NOT DETECTED NOT DETECTED Final   Coronavirus HKU1 NOT DETECTED NOT DETECTED Final   Coronavirus NL63 NOT DETECTED NOT DETECTED Final   Coronavirus OC43 NOT DETECTED NOT DETECTED Final   Metapneumovirus NOT DETECTED NOT DETECTED Final   Rhinovirus / Enterovirus DETECTED (A) NOT DETECTED Final   Influenza A NOT DETECTED NOT DETECTED Final   Influenza B NOT DETECTED NOT DETECTED Final   Parainfluenza Virus 1 NOT DETECTED NOT DETECTED Final   Parainfluenza Virus 2 NOT DETECTED NOT DETECTED Final   Parainfluenza Virus 3 NOT DETECTED NOT DETECTED Final   Parainfluenza Virus 4 NOT DETECTED NOT DETECTED Final   Respiratory Syncytial Virus NOT DETECTED NOT DETECTED Final   Bordetella pertussis NOT DETECTED NOT DETECTED Final   Chlamydophila pneumoniae NOT DETECTED NOT DETECTED Final   Mycoplasma pneumoniae NOT DETECTED NOT DETECTED Final    Comment: Performed at [facility]  MRSA PCR Screening     Status: None   Collection Time: 03/17/16  3:47 AM  Result Value Ref Range Status   MRSA by PCR NEGATIVE NEGATIVE Final    Comment:        The GeneXpert  MRSA Assay (FDA approved for NASAL specimens only), is one component of a comprehensive MRSA colonization surveillance program. It is not intended to diagnose MRSA infection nor to guide or monitor treatment for MRSA infections.      Labs: BNP (last 3 results) No results for input(s): BNP in the last 8760 hours. Basic Metabolic Panel:  Recent Labs Lab 03/18/16 0314 03/19/16 0605 03/19/16 1903 03/20/16 0553 03/21/16 0553 03/22/16 0503 03/23/16 1115  NA 142 143  --  143 142 140 139  K 3.3* 2.7* 3.2* 3.3* 3.2* 3.3* 3.9  CL 114* 112*  --  112* 110 108 105  CO2 19* 22  --  25 26 26 26   GLUCOSE 124* 111*  --  106* 101* 128* 171*  BUN 8 6  --  7 6 <5* 8  CREATININE 0.61 0.54  --  0.58 0.57 0.41* 0.66  CALCIUM 8.7* 8.2*  --  8.3* 8.5* 8.9 9.0  MG 1.8 1.7  --   --   --  1.5* 1.8   Liver Function Tests:  Recent Labs Lab 03/16/16 2301 03/17/16 0555  AST 39 39  ALT 33 32  ALKPHOS 109 81  BILITOT 0.7 0.5  PROT 7.2 6.3*  ALBUMIN 3.7 3.2*   No results for input(s): LIPASE, AMYLASE in the last 168 hours.  Recent Labs Lab 03/16/16 2256 03/17/16 0838 03/18/16 0314  AMMONIA 64* 19 40*   CBC:  Recent Labs Lab 03/16/16 2301 03/17/16 0555 03/19/16 0605 03/20/16 0553  WBC 19.8* 17.8* 10.6* 7.9  NEUTROABS 14.8*  --   --   --   HGB 13.2 12.8 10.4* 10.4*  HCT 41.8 38.7 31.2* 32.0*  MCV 96.1 93.7 90.7 94.4  PLT 265 208 141* 145*   Cardiac Enzymes:  Recent Labs Lab 03/16/16 2301 03/17/16 0135 03/17/16 0555 03/17/16 1159 03/17/16 1821  CKTOTAL  --  174  --   --   --   TROPONINI 0.46*  --  0.07* 0.19* 0.40*   BNP: Invalid input(s): POCBNP CBG:  Recent Labs Lab 03/22/16 2054 03/23/16 0055 03/23/16 0554 03/23/16 0850 03/23/16 1214  GLUCAP 97 133* 118* 92 157*   D-Dimer No results for input(s): DDIMER in the last 72 hours. Hgb A1c No results for input(s): HGBA1C in the last 72 hours. Lipid Profile No results for input(s): CHOL, HDL, LDLCALC, TRIG,  CHOLHDL, LDLDIRECT in the last 72 hours. Thyroid function studies No results for input(s): TSH, T4TOTAL, T3FREE, THYROIDAB in the last 72 hours.  Invalid input(s): FREET3 Anemia work up No results for input(s): VITAMINB12, FOLATE, FERRITIN, TIBC, IRON, RETICCTPCT in the last 72 hours. Urinalysis    Component Value Date/Time   COLORURINE YELLOW 03/17/2016 0034   APPEARANCEUR CLEAR 03/17/2016 0034   LABSPEC 1.021 03/17/2016 0034   PHURINE 6.5 03/17/2016 0034   GLUCOSEU NEGATIVE 03/17/2016 0034   HGBUR NEGATIVE 03/17/2016 0034   BILIRUBINUR NEGATIVE 03/17/2016 0034   KETONESUR NEGATIVE 03/17/2016 0034   PROTEINUR NEGATIVE 03/17/2016 0034   NITRITE NEGATIVE 03/17/2016 0034   LEUKOCYTESUR NEGATIVE 03/17/2016 0034   Sepsis Labs Invalid input(s): PROCALCITONIN,  WBC,  LACTICIDVEN Microbiology Recent Results (from the past 240 hour(s))  Culture, blood (x 2)     Status: None   Collection Time: 03/16/16 11:00 PM  Result Value Ref Range Status   Specimen Description BLOOD LEFT ANTECUBITAL  Final   Special Requests BOTTLES DRAWN AEROBIC AND ANAEROBIC 5CC EACH  Final   Culture   Final    NO GROWTH 5 DAYS Performed at [facility]    Report Status 03/22/2016 FINAL  Final  Culture, blood (x 2)     Status: None   Collection Time: 03/17/16  1:35 AM  Result Value Ref Range Status   Specimen Description BLOOD RIGHT ANTECUBITAL  Final   Special Requests BOTTLES DRAWN AEROBIC AND ANAEROBIC 5CC EACH  Final   Culture   Final    NO GROWTH 5 DAYS Performed at [facility]    Report Status 03/22/2016 FINAL  Final  Respiratory Panel by PCR     Status: Abnormal   Collection Time: 03/17/16  3:46 AM  Result Value Ref Range Status   Adenovirus NOT DETECTED NOT DETECTED Final   Coronavirus 229E NOT DETECTED NOT DETECTED Final   Coronavirus HKU1 NOT DETECTED NOT DETECTED Final   Coronavirus NL63 NOT DETECTED NOT DETECTED Final   Coronavirus OC43 NOT DETECTED NOT DETECTED Final    Metapneumovirus NOT DETECTED NOT DETECTED Final   Rhinovirus / Enterovirus DETECTED (A) NOT DETECTED Final   Influenza A NOT DETECTED NOT DETECTED Final   Influenza B NOT DETECTED NOT DETECTED Final   Parainfluenza Virus 1 NOT DETECTED NOT DETECTED Final  Parainfluenza Virus 2 NOT DETECTED NOT DETECTED Final   Parainfluenza Virus 3 NOT DETECTED NOT DETECTED Final   Parainfluenza Virus 4 NOT DETECTED NOT DETECTED Final   Respiratory Syncytial Virus NOT DETECTED NOT DETECTED Final   Bordetella pertussis NOT DETECTED NOT DETECTED Final   Chlamydophila pneumoniae NOT DETECTED NOT DETECTED Final   Mycoplasma pneumoniae NOT DETECTED NOT DETECTED Final    Comment: Performed at [facility]  MRSA PCR Screening     Status: None   Collection Time: 03/17/16  3:47 AM  Result Value Ref Range Status   MRSA by PCR NEGATIVE NEGATIVE Final    Comment:        The GeneXpert MRSA Assay (FDA approved for NASAL specimens only), is one component of a comprehensive MRSA colonization surveillance program. It is not intended to diagnose MRSA infection nor to guide or monitor treatment for MRSA infections.      Time coordinating discharge: Over 30 minutes  SIGNED:   Kathlen Mody, MD  [facility] 03/23/2016, 1:17 PM Pager   If 7PM-7AM, please contact night-coverage [email or web address] Password [email or web address]

## 2016-03-23 NOTE — Progress Notes (Signed)
Date: March 23, 2016 Discharge orders checked for needs. No case management needs present at time of discharge. Rhonda Davis, RN, BSN, CCM   [phone] 

## 2016-03-23 NOTE — Progress Notes (Signed)
Called daughter to let know [facility]

## 2016-04-09 ENCOUNTER — Encounter: Payer: Self-pay | Admitting: *Deleted

## 2016-04-10 ENCOUNTER — Encounter: Payer: Self-pay | Admitting: Diagnostic Neuroimaging

## 2016-04-10 ENCOUNTER — Ambulatory Visit (INDEPENDENT_AMBULATORY_CARE_PROVIDER_SITE_OTHER): Payer: Medicare Other | Admitting: Diagnostic Neuroimaging

## 2016-04-10 VITALS — BP 121/80 | HR 93

## 2016-04-10 DIAGNOSIS — F0390 Unspecified dementia without behavioral disturbance: Secondary | ICD-10-CM

## 2016-04-10 DIAGNOSIS — R259 Unspecified abnormal involuntary movements: Secondary | ICD-10-CM | POA: Diagnosis not present

## 2016-04-10 DIAGNOSIS — G253 Myoclonus: Secondary | ICD-10-CM

## 2016-04-10 DIAGNOSIS — F03C0 Unspecified dementia, severe, without behavioral disturbance, psychotic disturbance, mood disturbance, and anxiety: Secondary | ICD-10-CM

## 2016-04-10 NOTE — Patient Instructions (Addendum)
-   Diagnosis is: severe dementia  - muscle spasms are likely related to underlying dementia + metabolic / infectious causes  - I recommend supportive / palliative care

## 2016-04-10 NOTE — Progress Notes (Signed)
[facility]  PATIENT: Paige AreolaDaisy Flores DOB: Dec 23, 1940  REFERRING CLINICIAN: Renaee Mundaornelia Kurth, PA HISTORY FROM: patient's daughter (and chart review) REASON FOR VISIT: new consult   HISTORICAL  CHIEF COMPLAINT:  Chief Complaint  Patient presents with  . Seizure-like activity    rm 6, New Pt, dgtr- Hc POA- Lupita LeashDonna, lives at [facility], "episodes of jerking that made her fall out of wheelchair"    HISTORY OF PRESENT ILLNESS:   75 year old female with severe dementia, aphasia, hypertension, diabetes, here for evaluation of abnormal involuntary movements. Patient is not able to provide any history due to severe dementia and aphasia. I reviewed hospital admission notes as well as primary care notes. [place] living in [place] and began to develop dementia symptoms 10-20 years ago. Due to self-neglect and progression of dementia, patient was taken out of her home and [place] in [place] around 2013. Within 1-2 years she moved into assisted living. She has had continued progression of aphasia and dementia. Around 2014 patient began to have intermittent episodes of whole body jerks where she would suddenly jerk her whole body and fall out of her chair. These occurred randomly every 1-2 months. Symptoms have been gradually increasing.  Patient was admitted to the hospital in October 2017 for increasing confusion, and then admitted to the hospital. Her discharge diagnoses were healthcare associated pneumonia, lactic acidosis, respiratory failure with hypoxia, acute encephalopathy, malnutrition and seizure-like activity. Since discharge back to [facility] on 03/23/16, patient has had no further events of abnormal involuntary movements.  Apparently patient had been evaluated for this problem 1-2 months before admission and was tried empirically on gabapentin for these muscle spasms. After admission to the hospital October 2017 this  medication was stopped.   REVIEW OF SYSTEMS: Full 14 system review of systems performed and negative with exception of: Memory loss confusion seizure depression.  ALLERGIES: No Known Allergies  HOME MEDICATIONS: Outpatient Medications Prior to Visit  Medication Sig Dispense Refill  . cholecalciferol (VITAMIN D) 1000 units tablet Take 2,000 Units by mouth daily.    Marland Kitchen. diltiazem (DILACOR XR) 180 MG 24 hr capsule Take 180 mg by mouth daily.    Marland Kitchen. escitalopram (LEXAPRO) 10 MG tablet Take 10 mg by mouth daily.    . magnesium hydroxide (MILK OF MAGNESIA) 400 MG/5ML suspension Take 30 mLs by mouth at bedtime as needed for mild constipation.    . magnesium oxide (MAG-OX) 400 MG tablet Take 400 mg by mouth daily.    . Melatonin 5 MG TABS Take 1 tablet by mouth daily.    Marland Kitchen. OVER THE COUNTER MEDICATION Take 1 each by mouth 4 (four) times daily.    Marland Kitchen. acetaminophen (TYLENOL) 500 MG tablet Take 500 mg by mouth every 4 (four) hours as needed for mild pain.    Marland Kitchen. alum & mag hydroxide-simeth (MAALOX PLUS) 400-400-40 MG/5ML suspension Take 15 mLs by mouth every 6 (six) hours as needed for indigestion.    Marland Kitchen. neomycin-bacitracin-polymyxin (NEOSPORIN) ointment Apply 1 application topically as needed for wound care. apply to eye     No facility-administered medications prior to visit.     PAST MEDICAL HISTORY: Past Medical History:  Diagnosis Date  . Aphasia   . Bradycardia   . Dementia    vascular  . Depression   . Diabetes mellitus without complication (HCC)   . Hypertension     PAST SURGICAL HISTORY: Past Surgical History:  Procedure Laterality Date  .  PACEMAKER INSERTION  2000    FAMILY HISTORY: Family History  Problem Relation Age of Onset  . Dementia Mother   . Healthy Father     SOCIAL HISTORY:  Social History   Social History  . Marital status: Widowed    Spouse name: N/A  . Number of children: 3  . Years of education: 12   Occupational History  .      na   Social History  Main Topics  . Smoking status: Former Games developer  . Smokeless tobacco: Never Used  . Alcohol use No  . Drug use: No  . Sexual activity: Not on file   Other Topics Concern  . Not on file   Social History Narrative   Living at [facility] since April  2017   No caffeine     PHYSICAL EXAM  GENERAL EXAM/CONSTITUTIONAL: Vitals:  Vitals:   04/10/16 1059  BP: 121/80  Pulse: 93     There is no height or weight on file to calculate BMI.  No exam data present    Patient is in no distress; well developed, nourished and groomed; neck is supple  CARDIOVASCULAR:  Examination of carotid arteries is normal; no carotid bruits  Regular rate and rhythm, no murmurs  Examination of peripheral vascular system by observation and palpation is normal  EYES:  Ophthalmoscopic exam of optic discs and posterior segments --> NOT COOPERATING FOR FUNDOSCOPIC EXAM  MUSCULOSKELETAL:  Gait, strength, tone, movements noted in Neurologic exam below  NEUROLOGIC: MENTAL STATUS:  No flowsheet data found.  SEVERE DEMENTIA, LEANING FORWARD IN WHEEL CHAIR; HEAD FALLING DOWN; NO VERBAL COMMUNICATION. NOT FOLLOWING COMMANDS.  awake, alert, NOT oriented to person, place and time  NON VERBA; CANNOT DEMONSTRATE MEMORY RECALL, ATTENTION  SEVERE EXPRESSIVE AND RECEPTIVE APHASIA; CANNOT NAME OR WRITE  CANNOT DEMONSTRATE HER FUND OF KNOWLEDGE  CRANIAL NERVE:   2nd - NOT COOPERATING FOR FUNDOSCOPIC EXAM  2nd, 3rd, 4th, 6th - pupils equal and reactive to light, BLINKS TO THREAT; DOES NOT FOLLOW MY FINGER; no nystagmus  5th - facial sensation APPEARS symmetric  7th - facial strength APPEARS symmetric  8th - hearing intact  9th - NOT FOLLOWING COMMANDS  11th - NOT FOLLOWING COMMANDS  12th - NOT FOLLOWING COMMANDS  MOTOR:   normal bulk; MILD PARATONIA; MOVES ALL EXT SYMM  SENSORY:   normal and symmetric to light touch; WITHDRAWS SYMM  COORDINATION:   finger-nose-finger, fine finger  movements --> NOT COOPERATING FOR FUNDOSCOPIC EXAM  REFLEXES:   deep tendon reflexes TRACE and symmetric  GAIT/STATION:   IN WHEEL CHAIR; CANNOT STAND AND WALK INDEPENDENTLY    DIAGNOSTIC DATA (LABS, IMAGING, TESTING) - I reviewed patient records, labs, notes, testing and imaging myself where available.  Lab Results  Component Value Date   WBC 7.9 03/20/2016   HGB 10.4 (L) 03/20/2016   HCT 32.0 (L) 03/20/2016   MCV 94.4 03/20/2016   PLT 145 (L) 03/20/2016      Component Value Date/Time   NA 139 03/23/2016 1115   K 3.9 03/23/2016 1115   CL 105 03/23/2016 1115   CO2 26 03/23/2016 1115   GLUCOSE 171 (H) 03/23/2016 1115   BUN 8 03/23/2016 1115   CREATININE 0.66 03/23/2016 1115   CALCIUM 9.0 03/23/2016 1115   PROT 6.3 (L) 03/17/2016 0555   ALBUMIN 3.2 (L) 03/17/2016 0555   AST 39 03/17/2016 0555   ALT 32 03/17/2016 0555   ALKPHOS 81 03/17/2016 0555   BILITOT 0.5 03/17/2016  0555   GFRNONAA >60 03/23/2016 1115   GFRAA >60 03/23/2016 1115   No results found for: CHOL, HDL, LDLCALC, LDLDIRECT, TRIG, CHOLHDL No results found for: UJWJ1BHGBA1C No results found for: VITAMINB12 Lab Results  Component Value Date   TSH 1.321 03/18/2016    03/16/16 CT head [I reviewed images myself and agree with interpretation. -VRP]  - Marked central atrophy with moderate chronic small vessel ischemic disease of periventricular white matter. No acute intracranial abnormality. Frontal and bilateral maxillary sinusitis with air-fluid levels, findings are unchanged. Small left mastoid effusion also unchanged.     ASSESSMENT AND PLAN  75 y.o. year old female here with severe dementia with intermittent abnormal involuntary movements. Seizure would be a consideration although by description these sound more like myoclonic jerks. This can be seen in the setting of underlying advanced dementia as well as with superimposed toxic and metabolic etiologies. For now symptoms seem to be self-limited. Advised  patient's daughter on consideration for increased level of care to skilled nursing facility/memory care, although this would be better assessed by patient's primary care in assisted living team.   For now would recommend supportive/palliative care. In future could consider antiseizure medication such as levetiracetam if myoclonic jerks or seizures continued. I would avoid Depakote as patient had abnormal ammonia levels in recent hospitalization.   Dx:  1. Myoclonus   2. Abnormal involuntary movement   3. Severe dementia     PLAN: - For now would recommend supportive/palliative care. In future could consider antiseizure medication such as levetiracetam if myoclonic jerks or seizures continued. I would avoid Depakote as patient had abnormal ammonia levels in recent hospitalization.  Return if symptoms worsen or fail to improve, for return to PCP.    Suanne MarkerVIKRAM R. Trilby Way, MD 04/10/2016, 11:31 AM Certified in Neurology, Neurophysiology and Neuroimaging  [facility] [address], [address] [place], [place] [address] [phone]

## 2016-04-24 ENCOUNTER — Emergency Department (HOSPITAL_COMMUNITY)
Admission: EM | Admit: 2016-04-24 | Discharge: 2016-04-25 | Disposition: A | Discharge status: Home / Self Care | Payer: Medicare Other | Attending: Emergency Medicine | Admitting: Emergency Medicine

## 2016-04-24 ENCOUNTER — Encounter (HOSPITAL_COMMUNITY): Payer: Self-pay | Admitting: Emergency Medicine

## 2016-04-24 DIAGNOSIS — W19XXXA Unspecified fall, initial encounter: Secondary | ICD-10-CM | POA: Diagnosis not present

## 2016-04-24 DIAGNOSIS — Z950 Presence of cardiac pacemaker: Secondary | ICD-10-CM | POA: Diagnosis not present

## 2016-04-24 DIAGNOSIS — Y939 Activity, unspecified: Secondary | ICD-10-CM | POA: Diagnosis not present

## 2016-04-24 DIAGNOSIS — F0390 Unspecified dementia without behavioral disturbance: Secondary | ICD-10-CM | POA: Insufficient documentation

## 2016-04-24 DIAGNOSIS — Y999 Unspecified external cause status: Secondary | ICD-10-CM | POA: Insufficient documentation

## 2016-04-24 DIAGNOSIS — Y929 Unspecified place or not applicable: Secondary | ICD-10-CM | POA: Diagnosis not present

## 2016-04-24 DIAGNOSIS — Z87891 Personal history of nicotine dependence: Secondary | ICD-10-CM | POA: Insufficient documentation

## 2016-04-24 DIAGNOSIS — E119 Type 2 diabetes mellitus without complications: Secondary | ICD-10-CM | POA: Insufficient documentation

## 2016-04-24 DIAGNOSIS — I10 Essential (primary) hypertension: Secondary | ICD-10-CM | POA: Insufficient documentation

## 2016-04-24 NOTE — ED Provider Notes (Signed)
[facility]-EMERGENCY DEPT Provider Note   CSN: [id number] Arrival date & time: 04/24/16  2316  By signing my name below, I, [name], attest that this documentation has been prepared under the direction and in the presence of  Earley Favor, NP. Electronically Signed: [name], ED Scribe. 04/24/16. 11:22 PM.  History   Chief Complaint Chief Complaint  Patient presents with  . Fall   The history is provided by medical records. The history is limited by the condition of the patient. No language interpreter was used.   LEVEL 5 CAVEAT DUE TO DEMENTIA HPI Comments: Paige Flores is a 75 y.o. female who presents to the Emergency Department via EMS with a PMHx of dementia, DM and HTN for an evaluation s/p unwitnessed fall, at 10:30 PM. Pt denies any complaints.   Past Medical History:  Diagnosis Date  . Aphasia   . Bradycardia   . Dementia    vascular  . Depression   . Diabetes mellitus without complication (HCC)   . Hypertension     Patient Active Problem List   Diagnosis Date Noted  . Malnutrition of moderate degree 03/18/2016  . Sepsis (HCC) 03/17/2016  . HCAP (healthcare-associated pneumonia) 03/17/2016  . Lactic acidosis 03/17/2016  . Acute respiratory failure with hypoxia (HCC) 03/17/2016  . Elevated troponin 03/17/2016  . Seizure-like activity (HCC) 03/17/2016  . Acute encephalopathy 03/17/2016  . Paroxysmal atrial tachycardia (HCC) 01/03/2016  . SSS (sick sinus syndrome) (HCC) 01/03/2016  . Pacemaker 01/03/2016  . Murmur 01/03/2016  . Hyperlipidemia 01/03/2016    Past Surgical History:  Procedure Laterality Date  . PACEMAKER INSERTION  2000    OB History    No data available       Home Medications    Prior to Admission medications   Medication Sig Start Date End Date Taking? Authorizing Provider  acetaminophen (TYLENOL) 500 MG tablet Take 500 mg by mouth every 4 (four) hours as needed for mild pain.    Historical Provider, MD  alum & mag  hydroxide-simeth (MAALOX PLUS) 400-400-40 MG/5ML suspension Take 15 mLs by mouth every 6 (six) hours as needed for indigestion.    Historical Provider, MD  cholecalciferol (VITAMIN D) 1000 units tablet Take 2,000 Units by mouth daily.    Historical Provider, MD  diltiazem (DILACOR XR) 180 MG 24 hr capsule Take 180 mg by mouth daily.    Historical Provider, MD  escitalopram (LEXAPRO) 10 MG tablet Take 10 mg by mouth daily.    Historical Provider, MD  magnesium hydroxide (MILK OF MAGNESIA) 400 MG/5ML suspension Take 30 mLs by mouth at bedtime as needed for mild constipation.    Historical Provider, MD  magnesium oxide (MAG-OX) 400 MG tablet Take 400 mg by mouth daily.    Historical Provider, MD  Melatonin 5 MG TABS Take 1 tablet by mouth daily.    Historical Provider, MD  neomycin-bacitracin-polymyxin (NEOSPORIN) ointment Apply 1 application topically as needed for wound care. apply to eye    Historical Provider, MD  OVER THE COUNTER MEDICATION Take 1 each by mouth 4 (four) times daily.    Historical Provider, MD    Family History Family History  Problem Relation Age of Onset  . Dementia Mother   . Healthy Father     Social History Social History  Substance Use Topics  . Smoking status: Former Games developer  . Smokeless tobacco: Never Used  . Alcohol use No     Allergies   Patient has no known allergies.  Review of Systems Review of Systems  Unable to perform ROS: Patient nonverbal   Physical Exam Updated Vital Signs BP 122/56 (BP Location: Left Arm)   Pulse 67   Temp 97.7 F (36.5 C) (Oral)   Resp 17   SpO2 98%   Physical Exam  Constitutional: She appears well-developed and well-nourished.  HENT:  Head: Normocephalic.  Eyes: Pupils are equal, round, and reactive to light.  Neck: Normal range of motion.  Cardiovascular: Normal rate.   Pulmonary/Chest: Effort normal. She exhibits no tenderness.  Abdominal: Soft. She exhibits no distension. There is no tenderness.    Musculoskeletal: Normal range of motion. She exhibits no tenderness or deformity.  Neurological: She is alert.  Skin: Skin is warm and dry.    ED Treatments / Results  DIAGNOSTIC STUDIES: Oxygen Saturation is 95% on room air, normal by my interpretation.    COORDINATION OF CARE: 11:22 PM Discussed treatment plan with pt at bedside and pt agreed to plan.  Labs (all labs ordered are listed, but only abnormal results are displayed) Labs Reviewed - No data to display  EKG  EKG Interpretation None       Radiology Ct Head Wo Contrast  Result Date: 04/25/2016 CLINICAL DATA:  Status post fall, with concern for head or cervical spine injury. Initial encounter. EXAM: CT HEAD WITHOUT CONTRAST CT CERVICAL SPINE WITHOUT CONTRAST TECHNIQUE: Multidetector CT imaging of the head and cervical spine was performed following the standard protocol without intravenous contrast. Multiplanar CT image reconstructions of the cervical spine were also generated. COMPARISON:  CT of the head performed 03/16/2016, and CT of the cervical spine performed 03/14/2016 FINDINGS: CT HEAD FINDINGS Brain: No evidence of acute infarction, hemorrhage, hydrocephalus, extra-axial collection or mass lesion/mass effect. Prominence of the ventricles and sulci reflects moderately severe cortical volume loss. Mild cerebellar atrophy is noted. Scattered periventricular and subcortical white matter change likely reflects small vessel ischemic microangiopathy. Chronic ischemic change is noted at the external capsule bilaterally. The brainstem and fourth ventricle are within normal limits. The cerebral hemispheres demonstrate grossly normal gray-white differentiation. No mass effect or midline shift is seen. Vascular: No hyperdense vessel or unexpected calcification. Skull: There is no evidence of fracture; visualized osseous structures are unremarkable in appearance. Sinuses/Orbits: The orbits are within normal limits. The paranasal  sinuses and mastoid air cells are well-aerated. Other: No significant soft tissue abnormalities are seen. CT CERVICAL SPINE FINDINGS Alignment: There is grade 1 anterolisthesis of C4 on C5, reflecting underlying facet disease. Skull base and vertebrae: No acute fracture. No primary bone lesion or focal pathologic process. Soft tissues and spinal canal: No prevertebral fluid or swelling. No visible canal hematoma. Disc levels: Multilevel disc space narrowing is noted along the lower cervical and upper thoracic spine, with scattered anterior and posterior disc osteophyte complexes. Upper chest: The visualized portions of the thyroid gland are unremarkable. The visualized lung apices are grossly clear. Other: No additional soft tissue abnormalities are seen. IMPRESSION: 1. No evidence of traumatic intracranial injury or fracture. 2. No evidence of fracture or subluxation along the cervical spine. 3. Moderately severe cortical volume loss and scattered small vessel ischemic microangiopathy. 4. Chronic ischemic change at the external capsule bilaterally. 5. Mild diffuse degenerative change along the cervical and thoracic spine. Electronically Signed   By: Roanna RaiderJeffery  Chang M.D.   On: 04/25/2016 01:38   Ct Cervical Spine Wo Contrast  Result Date: 04/25/2016 CLINICAL DATA:  Status post fall, with concern for head or cervical spine injury.  Initial encounter. EXAM: CT HEAD WITHOUT CONTRAST CT CERVICAL SPINE WITHOUT CONTRAST TECHNIQUE: Multidetector CT imaging of the head and cervical spine was performed following the standard protocol without intravenous contrast. Multiplanar CT image reconstructions of the cervical spine were also generated. COMPARISON:  CT of the head performed 03/16/2016, and CT of the cervical spine performed 03/14/2016 FINDINGS: CT HEAD FINDINGS Brain: No evidence of acute infarction, hemorrhage, hydrocephalus, extra-axial collection or mass lesion/mass effect. Prominence of the ventricles and sulci  reflects moderately severe cortical volume loss. Mild cerebellar atrophy is noted. Scattered periventricular and subcortical white matter change likely reflects small vessel ischemic microangiopathy. Chronic ischemic change is noted at the external capsule bilaterally. The brainstem and fourth ventricle are within normal limits. The cerebral hemispheres demonstrate grossly normal gray-white differentiation. No mass effect or midline shift is seen. Vascular: No hyperdense vessel or unexpected calcification. Skull: There is no evidence of fracture; visualized osseous structures are unremarkable in appearance. Sinuses/Orbits: The orbits are within normal limits. The paranasal sinuses and mastoid air cells are well-aerated. Other: No significant soft tissue abnormalities are seen. CT CERVICAL SPINE FINDINGS Alignment: There is grade 1 anterolisthesis of C4 on C5, reflecting underlying facet disease. Skull base and vertebrae: No acute fracture. No primary bone lesion or focal pathologic process. Soft tissues and spinal canal: No prevertebral fluid or swelling. No visible canal hematoma. Disc levels: Multilevel disc space narrowing is noted along the lower cervical and upper thoracic spine, with scattered anterior and posterior disc osteophyte complexes. Upper chest: The visualized portions of the thyroid gland are unremarkable. The visualized lung apices are grossly clear. Other: No additional soft tissue abnormalities are seen. IMPRESSION: 1. No evidence of traumatic intracranial injury or fracture. 2. No evidence of fracture or subluxation along the cervical spine. 3. Moderately severe cortical volume loss and scattered small vessel ischemic microangiopathy. 4. Chronic ischemic change at the external capsule bilaterally. 5. Mild diffuse degenerative change along the cervical and thoracic spine. Electronically Signed   By: Roanna RaiderJeffery  Chang M.D.   On: 04/25/2016 01:38    Procedures Procedures (including critical care  time)  Medications Ordered in ED Medications - No data to display   Initial Impression / Assessment and Plan / ED Course  I have reviewed the triage vital signs and the nursing notes.  Pertinent labs & imaging results that were available during my care of the patient were reviewed by me and considered in my medical decision making (see chart for details).  Clinical Course    She was sent from nursing home after fall.  She is not complaining of any discomfort, although she has dementia.  She is moving all extremities.  CT scan of head and neck normal.  We discharge him with fall prevention instructions````````````````````````    Final Clinical Impressions(s) / ED Diagnoses   Final diagnoses:  Fall, initial encounter    New Prescriptions New Prescriptions   No medications on file   I personally performed the services described in this documentation, which was scribed in my presence. The recorded information has been reviewed and is accurate.   Earley FavorGail Clester Chlebowski, NP 04/25/16 [id number]    Blane OharaJoshua Zavitz, MD 04/25/16 [phone]

## 2016-04-24 NOTE — ED Notes (Signed)
[facility] Expected date:  Expected time:  Means of arrival:  Comments: EMS 75 yo female from SNF-fall/dementia

## 2016-04-24 NOTE — ED Triage Notes (Signed)
[facility] for evaluation of unwitnessed fall that occurred around 2230 tonight. Per EMS pt was alert and cooperative. Pt nonverbal at baseline. No obvious injury found by EMS. Towel roll applied to neck.

## 2016-04-25 ENCOUNTER — Emergency Department (HOSPITAL_COMMUNITY): Payer: Medicare Other

## 2016-04-25 DIAGNOSIS — F0390 Unspecified dementia without behavioral disturbance: Secondary | ICD-10-CM | POA: Diagnosis not present

## 2016-04-25 NOTE — ED Notes (Signed)
Weston SettleEryn, MT at [facility] notified of pt to be discharged.

## 2016-04-25 NOTE — Discharge Instructions (Signed)
Today your evaluated after a fall.  CT scan of your head and neck are within normal parameters.

## 2016-04-25 NOTE — ED Notes (Signed)
[facility] notified of need for transport of pt back to residence.  

## 2016-05-17 ENCOUNTER — Inpatient Hospital Stay (HOSPITAL_COMMUNITY)
Admission: EM | Admit: 2016-05-17 | Discharge: 2016-05-22 | DRG: 535 | Disposition: A | Discharge status: Skilled Nursing Facility | Payer: Medicare Other | Attending: Family Medicine | Admitting: Family Medicine

## 2016-05-17 ENCOUNTER — Encounter (HOSPITAL_COMMUNITY): Payer: Self-pay | Admitting: Nurse Practitioner

## 2016-05-17 ENCOUNTER — Emergency Department (HOSPITAL_COMMUNITY): Payer: Medicare Other

## 2016-05-17 DIAGNOSIS — I495 Sick sinus syndrome: Secondary | ICD-10-CM | POA: Diagnosis present

## 2016-05-17 DIAGNOSIS — R6510 Systemic inflammatory response syndrome (SIRS) of non-infectious origin without acute organ dysfunction: Secondary | ICD-10-CM | POA: Diagnosis present

## 2016-05-17 DIAGNOSIS — I10 Essential (primary) hypertension: Secondary | ICD-10-CM | POA: Diagnosis present

## 2016-05-17 DIAGNOSIS — E1165 Type 2 diabetes mellitus with hyperglycemia: Secondary | ICD-10-CM | POA: Diagnosis present

## 2016-05-17 DIAGNOSIS — Z993 Dependence on wheelchair: Secondary | ICD-10-CM

## 2016-05-17 DIAGNOSIS — Z66 Do not resuscitate: Secondary | ICD-10-CM | POA: Diagnosis present

## 2016-05-17 DIAGNOSIS — Z950 Presence of cardiac pacemaker: Secondary | ICD-10-CM | POA: Diagnosis present

## 2016-05-17 DIAGNOSIS — I350 Nonrheumatic aortic (valve) stenosis: Secondary | ICD-10-CM | POA: Diagnosis present

## 2016-05-17 DIAGNOSIS — Y92099 Unspecified place in other non-institutional residence as the place of occurrence of the external cause: Secondary | ICD-10-CM

## 2016-05-17 DIAGNOSIS — Z87891 Personal history of nicotine dependence: Secondary | ICD-10-CM

## 2016-05-17 DIAGNOSIS — E876 Hypokalemia: Secondary | ICD-10-CM | POA: Diagnosis present

## 2016-05-17 DIAGNOSIS — S72001A Fracture of unspecified part of neck of right femur, initial encounter for closed fracture: Secondary | ICD-10-CM

## 2016-05-17 DIAGNOSIS — F0150 Vascular dementia without behavioral disturbance: Secondary | ICD-10-CM | POA: Diagnosis present

## 2016-05-17 DIAGNOSIS — R000 Tachycardia, unspecified: Secondary | ICD-10-CM

## 2016-05-17 DIAGNOSIS — R509 Fever, unspecified: Secondary | ICD-10-CM

## 2016-05-17 DIAGNOSIS — R4701 Aphasia: Secondary | ICD-10-CM | POA: Diagnosis present

## 2016-05-17 DIAGNOSIS — S72011A Unspecified intracapsular fracture of right femur, initial encounter for closed fracture: Principal | ICD-10-CM | POA: Diagnosis present

## 2016-05-17 DIAGNOSIS — I4719 Other supraventricular tachycardia: Secondary | ICD-10-CM | POA: Diagnosis present

## 2016-05-17 DIAGNOSIS — E43 Unspecified severe protein-calorie malnutrition: Secondary | ICD-10-CM | POA: Diagnosis present

## 2016-05-17 DIAGNOSIS — A0472 Enterocolitis due to Clostridium difficile, not specified as recurrent: Secondary | ICD-10-CM | POA: Diagnosis present

## 2016-05-17 DIAGNOSIS — R54 Age-related physical debility: Secondary | ICD-10-CM | POA: Diagnosis present

## 2016-05-17 DIAGNOSIS — I480 Paroxysmal atrial fibrillation: Secondary | ICD-10-CM | POA: Diagnosis present

## 2016-05-17 DIAGNOSIS — Z79899 Other long term (current) drug therapy: Secondary | ICD-10-CM

## 2016-05-17 DIAGNOSIS — K5900 Constipation, unspecified: Secondary | ICD-10-CM | POA: Diagnosis present

## 2016-05-17 DIAGNOSIS — S0083XA Contusion of other part of head, initial encounter: Secondary | ICD-10-CM | POA: Diagnosis present

## 2016-05-17 DIAGNOSIS — I471 Supraventricular tachycardia: Secondary | ICD-10-CM | POA: Diagnosis present

## 2016-05-17 DIAGNOSIS — Z6820 Body mass index (BMI) 20.0-20.9, adult: Secondary | ICD-10-CM

## 2016-05-17 DIAGNOSIS — W19XXXA Unspecified fall, initial encounter: Secondary | ICD-10-CM

## 2016-05-17 DIAGNOSIS — W06XXXA Fall from bed, initial encounter: Secondary | ICD-10-CM | POA: Diagnosis present

## 2016-05-17 HISTORY — DX: Nontraumatic subdural hemorrhage, unspecified: I62.00

## 2016-05-17 LAB — COMPREHENSIVE METABOLIC PANEL
ALT: 15 U/L (ref 14–54)
ANION GAP: 9 (ref 5–15)
AST: 17 U/L (ref 15–41)
Albumin: 3.4 g/dL — ABNORMAL LOW (ref 3.5–5.0)
Alkaline Phosphatase: 113 U/L (ref 38–126)
BILIRUBIN TOTAL: 0.6 mg/dL (ref 0.3–1.2)
BUN: 14 mg/dL (ref 6–20)
CHLORIDE: 105 mmol/L (ref 101–111)
CO2: 28 mmol/L (ref 22–32)
Calcium: 9.1 mg/dL (ref 8.9–10.3)
Creatinine, Ser: 0.72 mg/dL (ref 0.44–1.00)
GFR calc Af Amer: >60 mL/min (ref >60)
GFR calc non Af Amer: >60 mL/min (ref >60)
GLUCOSE: 180 mg/dL — AB (ref 65–99)
POTASSIUM: 3.1 mmol/L — AB (ref 3.5–5.1)
SODIUM: 142 mmol/L (ref 135–145)
TOTAL PROTEIN: 7.6 g/dL (ref 6.5–8.1)

## 2016-05-17 LAB — CBC WITH DIFFERENTIAL/PLATELET
BASOS ABS: 0 10*3/uL (ref 0.0–0.1)
Basophils Relative: 0 %
Eosinophils Absolute: 0.1 10*3/uL (ref 0.0–0.7)
Eosinophils Relative: 1 %
HEMATOCRIT: 43.5 % (ref 36.0–46.0)
HEMOGLOBIN: 14.2 g/dL (ref 12.0–15.0)
LYMPHS PCT: 15 %
Lymphs Abs: 2.1 10*3/uL (ref 0.7–4.0)
MCH: 29.5 pg (ref 26.0–34.0)
MCHC: 32.6 g/dL (ref 30.0–36.0)
MCV: 90.2 fL (ref 78.0–100.0)
MONOS PCT: 13 %
Monocytes Absolute: 1.8 10*3/uL — ABNORMAL HIGH (ref 0.1–1.0)
NEUTROS ABS: 9.8 10*3/uL — AB (ref 1.7–7.7)
Neutrophils Relative %: 71 %
Platelets: 366 10*3/uL (ref 150–400)
RBC: 4.82 MIL/uL (ref 3.87–5.11)
RDW: 14.2 % (ref 11.5–15.5)
WBC: 13.8 10*3/uL — ABNORMAL HIGH (ref 4.0–10.5)

## 2016-05-17 LAB — URINALYSIS, ROUTINE W REFLEX MICROSCOPIC
BILIRUBIN URINE: NEGATIVE
Bacteria, UA: NONE SEEN
Glucose, UA: >=500 mg/dL — AB
HGB URINE DIPSTICK: NEGATIVE
KETONES UR: NEGATIVE mg/dL
LEUKOCYTES UA: NEGATIVE
NITRITE: NEGATIVE
PROTEIN: 30 mg/dL — AB
Specific Gravity, Urine: 1.026 (ref 1.005–1.030)
Squamous Epithelial / HPF: NONE SEEN
pH: 5 (ref 5.0–8.0)

## 2016-05-17 LAB — I-STAT CG4 LACTIC ACID, ED: LACTIC ACID, VENOUS: 1.32 mmol/L (ref 0.5–1.9)

## 2016-05-17 LAB — INFLUENZA PANEL BY PCR (TYPE A & B)
INFLAPCR: NEGATIVE
Influenza B By PCR: NEGATIVE

## 2016-05-17 MED ORDER — PIPERACILLIN-TAZOBACTAM 3.375 G IVPB 30 MIN
3.3750 g | Freq: Once | INTRAVENOUS | Status: AC
  Administered 2016-05-17: 3.375 g via INTRAVENOUS
  Filled 2016-05-17: qty 50

## 2016-05-17 MED ORDER — SODIUM CHLORIDE 0.9 % IV BOLUS (SEPSIS)
500.0000 mL | Freq: Once | INTRAVENOUS | Status: AC
  Administered 2016-05-17: 500 mL via INTRAVENOUS

## 2016-05-17 MED ORDER — VANCOMYCIN HCL IN DEXTROSE 1-5 GM/200ML-% IV SOLN
1000.0000 mg | Freq: Once | INTRAVENOUS | Status: AC
  Administered 2016-05-17: 1000 mg via INTRAVENOUS
  Filled 2016-05-17: qty 200

## 2016-05-17 MED ORDER — POTASSIUM CHLORIDE 10 MEQ/100ML IV SOLN
10.0000 meq | INTRAVENOUS | Status: AC
Start: 2016-05-17 — End: 2016-05-18
  Administered 2016-05-18 (×4): 10 meq via INTRAVENOUS
  Filled 2016-05-17 (×4): qty 100

## 2016-05-17 MED ORDER — SODIUM CHLORIDE 0.9 % IV BOLUS (SEPSIS)
1000.0000 mL | Freq: Once | INTRAVENOUS | Status: AC
  Administered 2016-05-17: 1000 mL via INTRAVENOUS

## 2016-05-17 MED ORDER — SODIUM CHLORIDE 0.9 % IV SOLN: INTRAVENOUS | Status: DC

## 2016-05-17 NOTE — ED Notes (Signed)
Bed: [id number] Expected date:  Expected time:  Means of arrival:  Comments: EMS- RM [age]y/o fall OOB

## 2016-05-17 NOTE — ED Triage Notes (Signed)
[facility] post unwitnessed fall evaluation. Reportedly nonverbal at baseline, not anticoagulants, no obvious injury.

## 2016-05-17 NOTE — ED Provider Notes (Signed)
[facility]-EMERGENCY DEPT Provider Note   CSN: [id number] Arrival date & time: 05/17/16  2007  By signing my name below, I, Linna Darner, attest that this documentation has been prepared under the direction and in the presence of [employer], PA-C. Electronically Signed: Linna Darner, Scribe. 05/17/2016. 8:36 PM.  History   Chief Complaint Chief Complaint  Patient presents with  . Fall    The history is provided by the patient and medical records. No language interpreter was used.     HPI Comments: LEVEL 5 CAVEAT DUE TO DEMENTIA Paige Flores is a 76 y.o. female brought in by EMS, with PMHx significant for vascular dementia, who presents to the Emergency Department complaining of an unwitnessed fall that occurred while getting out of bed shortly PTA. Per the nursing staff, pt sustained a bruise to her forehead during the fall and was found conscious. No other complaints noted at this time. She lives at [facility] [address]  Past Medical History:  Diagnosis Date  . Aphasia   . Bradycardia   . Dementia    vascular  . Depression   . Diabetes mellitus without complication (HCC)   . Hypertension     Patient Active Problem List   Diagnosis Date Noted  . Fever 05/17/2016  . Malnutrition of moderate degree 03/18/2016  . Sepsis (HCC) 03/17/2016  . HCAP (healthcare-associated pneumonia) 03/17/2016  . Lactic acidosis 03/17/2016  . Acute respiratory failure with hypoxia (HCC) 03/17/2016  . Elevated troponin 03/17/2016  . Seizure-like activity (HCC) 03/17/2016  . Acute encephalopathy 03/17/2016  . Paroxysmal atrial tachycardia (HCC) 01/03/2016  . SSS (sick sinus syndrome) (HCC) 01/03/2016  . Pacemaker 01/03/2016  . Murmur 01/03/2016  . Hyperlipidemia 01/03/2016    Past Surgical History:  Procedure Laterality Date  . PACEMAKER INSERTION  2000    OB History    No data available       Home Medications    Prior to Admission medications   Medication Sig Start Date End  Date Taking? Authorizing Provider  acetaminophen (TYLENOL) 500 MG tablet Take 500 mg by mouth every 4 (four) hours as needed for mild pain.   Yes Historical Provider, MD  alum & mag hydroxide-simeth (MAALOX PLUS) 400-400-40 MG/5ML suspension Take 15 mLs by mouth every 6 (six) hours as needed for indigestion.   Yes Historical Provider, MD  cholecalciferol (VITAMIN D) 1000 units tablet Take 2,000 Units by mouth daily.   Yes Historical Provider, MD  diltiazem (DILACOR XR) 180 MG 24 hr capsule Take 180 mg by mouth daily.   Yes Historical Provider, MD  escitalopram (LEXAPRO) 10 MG tablet Take 10 mg by mouth daily.   Yes Historical Provider, MD  fluconazole (DIFLUCAN) 100 MG tablet Take 100 mg by mouth daily. x5 days 05/15/16  Yes Historical Provider, MD  guaifenesin (ROBITUSSIN) 100 MG/5ML syrup Take 200 mg by mouth every 6 (six) hours as needed for cough.   Yes Historical Provider, MD  loperamide (IMODIUM A-D) 2 MG tablet Take 2 mg by mouth 3 (three) times daily as needed for diarrhea or loose stools.   Yes Historical Provider, MD  magnesium hydroxide (MILK OF MAGNESIA) 400 MG/5ML suspension Take 30 mLs by mouth at bedtime as needed for mild constipation.   Yes Historical Provider, MD  magnesium oxide (MAG-OX) 400 MG tablet Take 400 mg by mouth daily.   Yes Historical Provider, MD  Melatonin 5 MG TABS Take 10 mg by mouth daily.    Yes Historical Provider, MD  neomycin-bacitracin-polymyxin (NEOSPORIN) ointment  Apply 1 application topically as needed for wound care. apply to eye   Yes Historical Provider, MD  Nutritional Supplements (NUTRITIONAL DRINK PO) Take 1 each by mouth 4 (four) times daily. Mighty shakes   Yes Historical Provider, MD  FLUZONE HIGH-DOSE 0.5 ML SUSY  03/14/16   Historical Provider, MD    Family History Family History  Problem Relation Age of Onset  . Dementia Mother   . Healthy Father     Social History Social History  Substance Use Topics  . Smoking status: Former Games developermoker    . Smokeless tobacco: Never Used  . Alcohol use No     Allergies   Patient has no known allergies.   Review of Systems Review of Systems  Unable to perform ROS: Dementia     Physical Exam Updated Vital Signs BP 119/96 (BP Location: Right Arm)   Pulse 99   Temp 98.9 F (37.2 C) (Axillary)   Resp 18   SpO2 95%   Physical Exam  Constitutional: She appears well-developed and well-nourished. No distress.  Awake, alert, nontoxic appearance  HENT:  Head: Normocephalic.  Mouth/Throat: Oropharynx is clear and moist. No oropharyngeal exudate.  Small contusion to her forehead  Eyes: Conjunctivae are normal. Pupils are equal, round, and reactive to light. No scleral icterus.  Neck: Normal range of motion. Neck supple.  No step off or deformity. Towel roll in place.  Cardiovascular: Regular rhythm and intact distal pulses.  Tachycardia present.   Pulmonary/Chest: Effort normal and breath sounds normal. No respiratory distress. She has no wheezes.  Equal chest expansion  Abdominal: Soft. Bowel sounds are normal. She exhibits no mass. There is no tenderness. There is no rebound and no guarding.  Musculoskeletal: Normal range of motion. She exhibits no edema.  No bruising to her flank or back. No step off or deformity to T or L spine. Pelvis is stable. No bruising to either hip. FROM of all major joints. Bruising to the dorsum of the left hand.  Neurological: She is alert.  Moves extremities without ataxia  Skin: Skin is dry. No rash noted. She is not diaphoretic.  Skin is hot to touch. No rash.  Psychiatric: Her mood appears not anxious. She is not agitated.  Nursing note and vitals reviewed.    ED Treatments / Results  Labs (all labs ordered are listed, but only abnormal results are displayed) Labs Reviewed  COMPREHENSIVE METABOLIC PANEL - Abnormal; Notable for the following:       Result Value   Potassium 3.1 (*)    Glucose, Bld 180 (*)    Albumin 3.4 (*)    All other  components within normal limits  CBC WITH DIFFERENTIAL/PLATELET - Abnormal; Notable for the following:    WBC 13.8 (*)    Neutro Abs 9.8 (*)    Monocytes Absolute 1.8 (*)    All other components within normal limits  URINALYSIS, ROUTINE W REFLEX MICROSCOPIC - Abnormal; Notable for the following:    Color, Urine AMBER (*)    Glucose, UA >=500 (*)    Protein, ur 30 (*)    All other components within normal limits  CULTURE, BLOOD (ROUTINE X 2)  CULTURE, BLOOD (ROUTINE X 2)  URINE CULTURE  INFLUENZA PANEL BY PCR (TYPE A & B, H1N1)  I-STAT CG4 LACTIC ACID, ED     Radiology Ct Head Wo Contrast  Result Date: 05/17/2016 CLINICAL DATA:  Vascular dementia. Unwitnessed fall. Bruised along the forehead. EXAM: CT HEAD WITHOUT CONTRAST CT  CERVICAL SPINE WITHOUT CONTRAST TECHNIQUE: Multidetector CT imaging of the head and cervical spine was performed following the standard protocol without intravenous contrast. Multiplanar CT image reconstructions of the cervical spine were also generated. COMPARISON:  04/25/2016 FINDINGS: Despite efforts by the technologist and patient, motion artifact is present on today's exam and could not be eliminated. This reduces exam sensitivity and specificity. CT HEAD FINDINGS Brain: The brainstem, cerebellum, cerebral peduncles, thalami, basal ganglia, basilar cisterns, and ventricular system appear within normal limits. Periventricular white matter and corona radiata hypodensities favor chronic ischemic microvascular white matter disease. No intracranial hemorrhage, mass lesion, or acute CVA. Cerebral atrophy. Vascular: Unremarkable Skull: Unremarkable Sinuses/Orbits: Unremarkable Other: No supplemental non-categorized findings. CT CERVICAL SPINE FINDINGS Alignment: 2.5 mm degenerative anterolisthesis at C4-5, no change from 04/25/2016. Skull base and vertebrae: No fracture or acute subluxation is identified. Extensive multilevel degenerative facet arthropathy. Loss of disc  height at all levels between C5 and T2. Soft tissues and spinal canal: Unremarkable Disc levels: Osseous foraminal stenosis on the left that C3-4, C4-5, and C5-6 due to facet and uncinate spurring. Upper chest: Aortic arch atherosclerotic calcification. Other: No supplemental non-categorized findings. IMPRESSION: 1. No acute intracranial findings or acute cervical spine findings. 2. Periventricular white matter and corona radiata hypodensities favor chronic ischemic microvascular white matter disease. 3. Considerable cervical spondylosis and degenerative disc disease. 4. Aortic arch atherosclerosis. Electronically Signed   By: Gaylyn Rong M.D.   On: 05/17/2016 21:47   Ct Cervical Spine Wo Contrast  Result Date: 05/17/2016 CLINICAL DATA:  Vascular dementia. Unwitnessed fall. Bruised along the forehead. EXAM: CT HEAD WITHOUT CONTRAST CT CERVICAL SPINE WITHOUT CONTRAST TECHNIQUE: Multidetector CT imaging of the head and cervical spine was performed following the standard protocol without intravenous contrast. Multiplanar CT image reconstructions of the cervical spine were also generated. COMPARISON:  04/25/2016 FINDINGS: Despite efforts by the technologist and patient, motion artifact is present on today's exam and could not be eliminated. This reduces exam sensitivity and specificity. CT HEAD FINDINGS Brain: The brainstem, cerebellum, cerebral peduncles, thalami, basal ganglia, basilar cisterns, and ventricular system appear within normal limits. Periventricular white matter and corona radiata hypodensities favor chronic ischemic microvascular white matter disease. No intracranial hemorrhage, mass lesion, or acute CVA. Cerebral atrophy. Vascular: Unremarkable Skull: Unremarkable Sinuses/Orbits: Unremarkable Other: No supplemental non-categorized findings. CT CERVICAL SPINE FINDINGS Alignment: 2.5 mm degenerative anterolisthesis at C4-5, no change from 04/25/2016. Skull base and vertebrae: No fracture or  acute subluxation is identified. Extensive multilevel degenerative facet arthropathy. Loss of disc height at all levels between C5 and T2. Soft tissues and spinal canal: Unremarkable Disc levels: Osseous foraminal stenosis on the left that C3-4, C4-5, and C5-6 due to facet and uncinate spurring. Upper chest: Aortic arch atherosclerotic calcification. Other: No supplemental non-categorized findings. IMPRESSION: 1. No acute intracranial findings or acute cervical spine findings. 2. Periventricular white matter and corona radiata hypodensities favor chronic ischemic microvascular white matter disease. 3. Considerable cervical spondylosis and degenerative disc disease. 4. Aortic arch atherosclerosis. Electronically Signed   By: Gaylyn Rong M.D.   On: 05/17/2016 21:47   Dg Chest Port 1 View  Result Date: 05/17/2016 CLINICAL DATA:  Altered mental status, unwitnessed fall. Dementia. History of hypertension, diabetes, bradycardia and former smoker. EXAM: PORTABLE CHEST 1 VIEW COMPARISON:  03/16/2016 FINDINGS: The heart is top-normal in size. The aorta is tortuous and atherosclerotic. Right atrial and right ventricular pacing wires are noted with left-sided pacemaker apparatus projecting over the left axilla. Lungs are free of pneumonic  consolidations, CHF and effusions. No pneumothoraces. No acute osseous abnormality. The patient's chin obscures the medial lung apices. IMPRESSION: No active disease. Electronically Signed   By: Tollie Ethavid  Kwon M.D.   On: 05/17/2016 21:45    Procedures Procedures (including critical care time)  DIAGNOSTIC STUDIES: Oxygen Saturation is 95% on RA, adequate by my interpretation.    Medications Ordered in ED Medications  sodium chloride 0.9 % 1,000 mL with potassium chloride 80 mEq infusion (not administered)  sodium chloride 0.9 % bolus 1,000 mL (1,000 mLs Intravenous New Bag/Given 05/17/16 2202)    And  sodium chloride 0.9 % bolus 500 mL (500 mLs Intravenous New Bag/Given  05/17/16 2204)  piperacillin-tazobactam (ZOSYN) IVPB 3.375 g (0 g Intravenous Stopped 05/17/16 2232)  vancomycin (VANCOCIN) IVPB 1000 mg/200 mL premix (1,000 mg Intravenous New Bag/Given 05/17/16 2202)     Initial Impression / Assessment and Plan / ED Course  I have reviewed the triage vital signs and the nursing notes.  Pertinent labs & imaging results that were available during my care of the patient were reviewed by me and considered in my medical decision making (see chart for details).  Clinical Course as of May 17 2332  Wynelle LinkSun May 17, 2016  2050 Per Morrie SheldonAshley [facility] pt was found face down on the floor next to her bed around 7pm. She was last seen around 6pm in bed.  Pt was alert and conscious when found.  Staff reports she is nonverbal, with babbling at baseline with a hx of dementia.  No anticoagulants.  Pt is currently on an "antibiotic for a vaginal yeast infection" but staff denies fevers at home.     [HM]  2318 Discussed with Ms. Frey (daughter) who would rather the pt be admitted for observation.    [HM]  2329 Discussed with Dr. Toniann FailKakrakandy who will admit for obs tele.    [HM]  2332 Repletion begun Potassium: (!) 3.1 [HM]  2332 Leukocytosis WBC: (!) 13.8 [HM]  2332 Febrile Temp: 101.1 F (38.4 C) [HM]  2332 Normal Lactic Acid, Venous: 1.32 [HM]  2332 Negative Influenza B By PCR: NEGATIVE [HM]  2332 No UTI Leukocytes, UA: NEGATIVE [HM]  2332 No acute cranial or cervical abnormality CT Head Wo Contrast [HM]  2333 No evidence of pneumonia DG Chest Port 1 View [HM]    Clinical Course User Index [HM] Dahlia ClientHannah Normon Pettijohn, PA-C   Pt presents with unwitnessed fall and found to have fever.  Unkown source.  Will admit for obs.    Final Clinical Impressions(s) / ED Diagnoses   Final diagnoses:  Contusion of face, initial encounter  Fever, unspecified fever cause  Tachycardia  Hypokalemia    New Prescriptions New Prescriptions   No medications on file        Dierdre ForthHannah Luman Holway, PA-C 05/17/16 [phone]    Pricilla LovelessScott Goldston, MD 05/18/16 [phone]

## 2016-05-18 ENCOUNTER — Encounter (HOSPITAL_COMMUNITY): Payer: Self-pay | Admitting: Internal Medicine

## 2016-05-18 ENCOUNTER — Observation Stay (HOSPITAL_COMMUNITY): Payer: Medicare Other

## 2016-05-18 DIAGNOSIS — E43 Unspecified severe protein-calorie malnutrition: Secondary | ICD-10-CM | POA: Diagnosis present

## 2016-05-18 DIAGNOSIS — Y92099 Unspecified place in other non-institutional residence as the place of occurrence of the external cause: Secondary | ICD-10-CM | POA: Diagnosis not present

## 2016-05-18 DIAGNOSIS — S0083XA Contusion of other part of head, initial encounter: Secondary | ICD-10-CM

## 2016-05-18 DIAGNOSIS — Z87891 Personal history of nicotine dependence: Secondary | ICD-10-CM | POA: Diagnosis not present

## 2016-05-18 DIAGNOSIS — Z79899 Other long term (current) drug therapy: Secondary | ICD-10-CM | POA: Diagnosis not present

## 2016-05-18 DIAGNOSIS — I495 Sick sinus syndrome: Secondary | ICD-10-CM | POA: Diagnosis present

## 2016-05-18 DIAGNOSIS — I480 Paroxysmal atrial fibrillation: Secondary | ICD-10-CM | POA: Diagnosis present

## 2016-05-18 DIAGNOSIS — R4701 Aphasia: Secondary | ICD-10-CM | POA: Diagnosis present

## 2016-05-18 DIAGNOSIS — S72001A Fracture of unspecified part of neck of right femur, initial encounter for closed fracture: Secondary | ICD-10-CM | POA: Diagnosis not present

## 2016-05-18 DIAGNOSIS — E876 Hypokalemia: Secondary | ICD-10-CM | POA: Diagnosis present

## 2016-05-18 DIAGNOSIS — R6510 Systemic inflammatory response syndrome (SIRS) of non-infectious origin without acute organ dysfunction: Secondary | ICD-10-CM | POA: Diagnosis present

## 2016-05-18 DIAGNOSIS — Z6820 Body mass index (BMI) 20.0-20.9, adult: Secondary | ICD-10-CM | POA: Diagnosis not present

## 2016-05-18 DIAGNOSIS — A0472 Enterocolitis due to Clostridium difficile, not specified as recurrent: Secondary | ICD-10-CM | POA: Diagnosis present

## 2016-05-18 DIAGNOSIS — K5900 Constipation, unspecified: Secondary | ICD-10-CM | POA: Diagnosis present

## 2016-05-18 DIAGNOSIS — Z950 Presence of cardiac pacemaker: Secondary | ICD-10-CM | POA: Diagnosis not present

## 2016-05-18 DIAGNOSIS — I471 Supraventricular tachycardia: Secondary | ICD-10-CM | POA: Diagnosis present

## 2016-05-18 DIAGNOSIS — Z993 Dependence on wheelchair: Secondary | ICD-10-CM | POA: Diagnosis not present

## 2016-05-18 DIAGNOSIS — Z66 Do not resuscitate: Secondary | ICD-10-CM | POA: Diagnosis present

## 2016-05-18 DIAGNOSIS — R54 Age-related physical debility: Secondary | ICD-10-CM | POA: Diagnosis present

## 2016-05-18 DIAGNOSIS — S72009A Fracture of unspecified part of neck of unspecified femur, initial encounter for closed fracture: Secondary | ICD-10-CM | POA: Diagnosis not present

## 2016-05-18 DIAGNOSIS — I10 Essential (primary) hypertension: Secondary | ICD-10-CM | POA: Diagnosis present

## 2016-05-18 DIAGNOSIS — W19XXXA Unspecified fall, initial encounter: Secondary | ICD-10-CM

## 2016-05-18 DIAGNOSIS — E1165 Type 2 diabetes mellitus with hyperglycemia: Secondary | ICD-10-CM | POA: Diagnosis present

## 2016-05-18 DIAGNOSIS — W06XXXA Fall from bed, initial encounter: Secondary | ICD-10-CM | POA: Diagnosis present

## 2016-05-18 DIAGNOSIS — S72011A Unspecified intracapsular fracture of right femur, initial encounter for closed fracture: Secondary | ICD-10-CM | POA: Diagnosis present

## 2016-05-18 DIAGNOSIS — F0150 Vascular dementia without behavioral disturbance: Secondary | ICD-10-CM | POA: Diagnosis present

## 2016-05-18 DIAGNOSIS — I350 Nonrheumatic aortic (valve) stenosis: Secondary | ICD-10-CM | POA: Diagnosis present

## 2016-05-18 LAB — CBC
HEMATOCRIT: 38.3 % (ref 36.0–46.0)
Hemoglobin: 12.6 g/dL (ref 12.0–15.0)
MCH: 29.6 pg (ref 26.0–34.0)
MCHC: 32.9 g/dL (ref 30.0–36.0)
MCV: 89.9 fL (ref 78.0–100.0)
Platelets: 320 10*3/uL (ref 150–400)
RBC: 4.26 MIL/uL (ref 3.87–5.11)
RDW: 14.3 % (ref 11.5–15.5)
WBC: 11.8 10*3/uL — AB (ref 4.0–10.5)

## 2016-05-18 LAB — BASIC METABOLIC PANEL
ANION GAP: 7 (ref 5–15)
BUN: 9 mg/dL (ref 6–20)
CHLORIDE: 109 mmol/L (ref 101–111)
CO2: 25 mmol/L (ref 22–32)
Calcium: 8.2 mg/dL — ABNORMAL LOW (ref 8.9–10.3)
Creatinine, Ser: 0.63 mg/dL (ref 0.44–1.00)
GFR calc Af Amer: >60 mL/min (ref >60)
Glucose, Bld: 131 mg/dL — ABNORMAL HIGH (ref 65–99)
Potassium: 3.8 mmol/L (ref 3.5–5.1)
SODIUM: 141 mmol/L (ref 135–145)

## 2016-05-18 LAB — MRSA PCR SCREENING: MRSA by PCR: NEGATIVE

## 2016-05-18 MED ORDER — ACETAMINOPHEN 650 MG RE SUPP: 650.0000 mg | Freq: Four times a day (QID) | RECTAL | Status: DC | PRN

## 2016-05-18 MED ORDER — ENOXAPARIN SODIUM 40 MG/0.4ML ~~LOC~~ SOLN
40.0000 mg | SUBCUTANEOUS | Status: DC
  Filled 2016-05-18: qty 0.4

## 2016-05-18 MED ORDER — MORPHINE SULFATE (PF) 2 MG/ML IV SOLN: 0.5000 mg | INTRAVENOUS | Status: DC | PRN

## 2016-05-18 MED ORDER — ONDANSETRON HCL 4 MG/2ML IJ SOLN: 4.0000 mg | Freq: Four times a day (QID) | INTRAMUSCULAR | Status: DC | PRN

## 2016-05-18 MED ORDER — ONDANSETRON HCL 4 MG PO TABS: 4.0000 mg | ORAL_TABLET | Freq: Four times a day (QID) | ORAL | Status: DC | PRN

## 2016-05-18 MED ORDER — ACETAMINOPHEN 325 MG PO TABS: 650.0000 mg | ORAL_TABLET | Freq: Four times a day (QID) | ORAL | Status: DC | PRN

## 2016-05-18 MED ORDER — SODIUM CHLORIDE 0.9 % IV SOLN
INTRAVENOUS | Status: DC
  Administered 2016-05-18: 02:00:00 via INTRAVENOUS

## 2016-05-18 MED ORDER — ESCITALOPRAM OXALATE 10 MG PO TABS
10.0000 mg | ORAL_TABLET | Freq: Every day | ORAL | Status: DC
  Administered 2016-05-19 – 2016-05-22 (×4): 10 mg via ORAL
  Filled 2016-05-18 (×5): qty 1

## 2016-05-18 MED ORDER — DILTIAZEM HCL ER COATED BEADS 180 MG PO CP24
180.0000 mg | ORAL_CAPSULE | Freq: Every day | ORAL | Status: DC
  Filled 2016-05-18: qty 1

## 2016-05-18 MED ORDER — ENOXAPARIN SODIUM 30 MG/0.3ML ~~LOC~~ SOLN
30.0000 mg | SUBCUTANEOUS | Status: DC
  Administered 2016-05-18 – 2016-05-21 (×4): 30 mg via SUBCUTANEOUS
  Filled 2016-05-18 (×4): qty 0.3

## 2016-05-18 MED ORDER — BISACODYL 10 MG RE SUPP: 10.0000 mg | Freq: Every day | RECTAL | Status: DC | PRN

## 2016-05-18 MED ORDER — PIPERACILLIN-TAZOBACTAM 3.375 G IVPB
3.3750 g | Freq: Three times a day (TID) | INTRAVENOUS | Status: DC
Start: 2016-05-18 — End: 2016-05-21
  Administered 2016-05-18 – 2016-05-21 (×9): 3.375 g via INTRAVENOUS
  Filled 2016-05-18 (×11): qty 50

## 2016-05-18 MED ORDER — HYDROCODONE-ACETAMINOPHEN 5-325 MG PO TABS: 1.0000 | ORAL_TABLET | ORAL | Status: DC | PRN

## 2016-05-18 MED ORDER — SODIUM CHLORIDE 0.9 % IV SOLN
500.0000 mg | Freq: Two times a day (BID) | INTRAVENOUS | Status: DC
  Administered 2016-05-18: 500 mg via INTRAVENOUS
  Filled 2016-05-18: qty 500

## 2016-05-18 MED ORDER — POLYETHYLENE GLYCOL 3350 17 G PO PACK: 17.0000 g | PACK | Freq: Every day | ORAL | Status: DC

## 2016-05-18 NOTE — Progress Notes (Signed)
Pharmacy Antibiotic Note  Paige Flores is a 75 y.o. female admitted on 05/17/2016 with sepsis.  Pharmacy has been consulted for Vancomycin and Zosyn  dosing.  Plan: Vancomycin 500mg  IV every 12 hours.  Goal trough 15-20 mcg/mL. Zosyn 3.375g IV q8h (4 hour infusion).  Weight: 111 lb 12.4 oz (50.7 kg)  Temp (24hrs), Avg:99.6 F (37.6 C), Min:98.7 F (37.1 C), Max:101.1 F (38.4 C)   Recent Labs Lab 05/17/16 2148 05/17/16 2149  WBC 13.8*  --   CREATININE 0.72  --   LATICACIDVEN  --  1.32    Estimated Creatinine Clearance: 41.7 mL/min (by C-G formula based on SCr of 0.72 mg/dL).    No Known Allergies  Antimicrobials this admission: Vancomycin 05/17/2016 >> Zosyn 05/17/2016 >>   Dose adjustments this admission: -  Microbiology results: pending  Thank you for allowing pharmacy to be a part of this patient's care.  Aleene DavidsonGrimsley Jr, Hafsa Lohn Crowford 05/18/2016 3:19 AM

## 2016-05-18 NOTE — H&P (Addendum)
History and Physical    Troyce Febo ZOX:[id number] DOB: February 15, 1941 DOA: 05/17/2016  PCP: Ron Parker, MD  Patient coming from: Nursing home.  Chief Complaint: Fall.  HPI: Paige Flores is a 75 y.o. female with advanced dementia, sick sinus syndrome status post pacemaker placement, paroxysmal atrial tachycardia was brought to the ER, after patient had an unwitnessed fall at her nursing facility. Patient is noncommunicative. Most of the history is obtained from the ER physician. While in the ER patient was found to be febrile and tachycardic and had leukocytosis. Patient was recently admitted to the hospital in October for healthcare associated pneumonia and sepsis. Patient had blood cultures drawn and started on empiric antibiotics. Source of fever is not clear. Chest x-ray and UA are unremarkable.   ED Course: Chest x-ray and UA unremarkable. CT head CT C-spine nothing acute.  Review of Systems: As per HPI, rest all negative.   Past Medical History:  Diagnosis Date  . Aphasia   . Bradycardia   . Dementia    vascular  . Depression   . Diabetes mellitus without complication (HCC)   . Hypertension     Past Surgical History:  Procedure Laterality Date  . PACEMAKER INSERTION  2000     reports that she has quit smoking. She has never used smokeless tobacco. She reports that she does not drink alcohol or use drugs.  No Known Allergies  Family History  Problem Relation Age of Onset  . Dementia Mother   . Healthy Father     Prior to Admission medications   Medication Sig Start Date End Date Taking? Authorizing Provider  acetaminophen (TYLENOL) 500 MG tablet Take 500 mg by mouth every 4 (four) hours as needed for mild pain.   Yes Historical Provider, MD  alum & mag hydroxide-simeth (MAALOX PLUS) 400-400-40 MG/5ML suspension Take 15 mLs by mouth every 6 (six) hours as needed for indigestion.   Yes Historical Provider, MD  cholecalciferol (VITAMIN D) 1000 units tablet Take  2,000 Units by mouth daily.   Yes Historical Provider, MD  diltiazem (DILACOR XR) 180 MG 24 hr capsule Take 180 mg by mouth daily.   Yes Historical Provider, MD  escitalopram (LEXAPRO) 10 MG tablet Take 10 mg by mouth daily.   Yes Historical Provider, MD  fluconazole (DIFLUCAN) 100 MG tablet Take 100 mg by mouth daily. x5 days 05/15/16  Yes Historical Provider, MD  guaifenesin (ROBITUSSIN) 100 MG/5ML syrup Take 200 mg by mouth every 6 (six) hours as needed for cough.   Yes Historical Provider, MD  loperamide (IMODIUM A-D) 2 MG tablet Take 2 mg by mouth 3 (three) times daily as needed for diarrhea or loose stools.   Yes Historical Provider, MD  magnesium hydroxide (MILK OF MAGNESIA) 400 MG/5ML suspension Take 30 mLs by mouth at bedtime as needed for mild constipation.   Yes Historical Provider, MD  magnesium oxide (MAG-OX) 400 MG tablet Take 400 mg by mouth daily.   Yes Historical Provider, MD  Melatonin 5 MG TABS Take 10 mg by mouth daily.    Yes Historical Provider, MD  neomycin-bacitracin-polymyxin (NEOSPORIN) ointment Apply 1 application topically as needed for wound care. apply to eye   Yes Historical Provider, MD  Nutritional Supplements (NUTRITIONAL DRINK PO) Take 1 each by mouth 4 (four) times daily. Mighty shakes   Yes Historical Provider, MD  FLUZONE HIGH-DOSE 0.5 ML SUSY  03/14/16   Historical Provider, MD    Physical Exam: Vitals:   05/17/16 2112 05/17/16 2206  05/17/16 2230 05/18/16 0040  BP: 119/82 106/70 116/73 122/65  Pulse:  81 73 70  Resp: 16 20 18 18   Temp:    98.7 F (37.1 C)  TempSrc:    Oral  SpO2: 96% 99% 100% 100%  Weight: 50.7 kg (111 lb 12.4 oz)         Constitutional: Moderately built and nourished. Vitals:   05/17/16 2112 05/17/16 2206 05/17/16 2230 05/18/16 0040  BP: 119/82 106/70 116/73 122/65  Pulse:  81 73 70  Resp: 16 20 18 18   Temp:    98.7 F (37.1 C)  TempSrc:    Oral  SpO2: 96% 99% 100% 100%  Weight: 50.7 kg (111 lb 12.4 oz)      Eyes:  Anicteric no pallor. ENMT: No discharge from the ears eyes nose or mouth. Neck: No mass felt. No neck rigidity. Respiratory: No rhonchi or crepitations. Cardiovascular: S1 and S2 heard. Abdomen: Soft nontender bowel sounds present. No guarding or rigidity. Musculoskeletal: No edema. Skin: No rash. Neurologic: Alert awake demented. Does not follow commands. Psychiatric: Patient is demented.   Labs on Admission: I have personally reviewed following labs and imaging studies  CBC:  Recent Labs Lab 05/17/16 2148  WBC 13.8*  NEUTROABS 9.8*  HGB 14.2  HCT 43.5  MCV 90.2  PLT 366   Basic Metabolic Panel:  Recent Labs Lab 05/17/16 2148  NA 142  K 3.1*  CL 105  CO2 28  GLUCOSE 180*  BUN 14  CREATININE 0.72  CALCIUM 9.1   GFR: Estimated Creatinine Clearance: 41.7 mL/min (by C-G formula based on SCr of 0.72 mg/dL). Liver Function Tests:  Recent Labs Lab 05/17/16 2148  AST 17  ALT 15  ALKPHOS 113  BILITOT 0.6  PROT 7.6  ALBUMIN 3.4*   No results for input(s): LIPASE, AMYLASE in the last 168 hours. No results for input(s): AMMONIA in the last 168 hours. Coagulation Profile: No results for input(s): INR, PROTIME in the last 168 hours. Cardiac Enzymes: No results for input(s): CKTOTAL, CKMB, CKMBINDEX, TROPONINI in the last 168 hours. BNP (last 3 results) No results for input(s): PROBNP in the last 8760 hours. HbA1C: No results for input(s): HGBA1C in the last 72 hours. CBG: No results for input(s): GLUCAP in the last 168 hours. Lipid Profile: No results for input(s): CHOL, HDL, LDLCALC, TRIG, CHOLHDL, LDLDIRECT in the last 72 hours. Thyroid Function Tests: No results for input(s): TSH, T4TOTAL, FREET4, T3FREE, THYROIDAB in the last 72 hours. Anemia Panel: No results for input(s): VITAMINB12, FOLATE, FERRITIN, TIBC, IRON, RETICCTPCT in the last 72 hours. Urine analysis:    Component Value Date/Time   COLORURINE AMBER (A) 05/17/2016 2132   APPEARANCEUR CLEAR  05/17/2016 2132   LABSPEC 1.026 05/17/2016 2132   PHURINE 5.0 05/17/2016 2132   GLUCOSEU >=500 (A) 05/17/2016 2132   HGBUR NEGATIVE 05/17/2016 2132   BILIRUBINUR NEGATIVE 05/17/2016 2132   KETONESUR NEGATIVE 05/17/2016 2132   PROTEINUR 30 (A) 05/17/2016 2132   NITRITE NEGATIVE 05/17/2016 2132   LEUKOCYTESUR NEGATIVE 05/17/2016 2132   Sepsis Labs: @LABRCNTIP (procalcitonin:4,lacticidven:4) )No results found for this or any previous visit (from the past 240 hour(s)).   Radiological Exams on Admission: Ct Head Wo Contrast  Result Date: 05/17/2016 CLINICAL DATA:  Vascular dementia. Unwitnessed fall. Bruised along the forehead. EXAM: CT HEAD WITHOUT CONTRAST CT CERVICAL SPINE WITHOUT CONTRAST TECHNIQUE: Multidetector CT imaging of the head and cervical spine was performed following the standard protocol without intravenous contrast. Multiplanar CT image reconstructions of  the cervical spine were also generated. COMPARISON:  04/25/2016 FINDINGS: Despite efforts by the technologist and patient, motion artifact is present on today's exam and could not be eliminated. This reduces exam sensitivity and specificity. CT HEAD FINDINGS Brain: The brainstem, cerebellum, cerebral peduncles, thalami, basal ganglia, basilar cisterns, and ventricular system appear within normal limits. Periventricular white matter and corona radiata hypodensities favor chronic ischemic microvascular white matter disease. No intracranial hemorrhage, mass lesion, or acute CVA. Cerebral atrophy. Vascular: Unremarkable Skull: Unremarkable Sinuses/Orbits: Unremarkable Other: No supplemental non-categorized findings. CT CERVICAL SPINE FINDINGS Alignment: 2.5 mm degenerative anterolisthesis at C4-5, no change from 04/25/2016. Skull base and vertebrae: No fracture or acute subluxation is identified. Extensive multilevel degenerative facet arthropathy. Loss of disc height at all levels between C5 and T2. Soft tissues and spinal canal:  Unremarkable Disc levels: Osseous foraminal stenosis on the left that C3-4, C4-5, and C5-6 due to facet and uncinate spurring. Upper chest: Aortic arch atherosclerotic calcification. Other: No supplemental non-categorized findings. IMPRESSION: 1. No acute intracranial findings or acute cervical spine findings. 2. Periventricular white matter and corona radiata hypodensities favor chronic ischemic microvascular white matter disease. 3. Considerable cervical spondylosis and degenerative disc disease. 4. Aortic arch atherosclerosis. Electronically Signed   By: Gaylyn RongWalter  Liebkemann M.D.   On: 05/17/2016 21:47   Ct Cervical Spine Wo Contrast  Result Date: 05/17/2016 CLINICAL DATA:  Vascular dementia. Unwitnessed fall. Bruised along the forehead. EXAM: CT HEAD WITHOUT CONTRAST CT CERVICAL SPINE WITHOUT CONTRAST TECHNIQUE: Multidetector CT imaging of the head and cervical spine was performed following the standard protocol without intravenous contrast. Multiplanar CT image reconstructions of the cervical spine were also generated. COMPARISON:  04/25/2016 FINDINGS: Despite efforts by the technologist and patient, motion artifact is present on today's exam and could not be eliminated. This reduces exam sensitivity and specificity. CT HEAD FINDINGS Brain: The brainstem, cerebellum, cerebral peduncles, thalami, basal ganglia, basilar cisterns, and ventricular system appear within normal limits. Periventricular white matter and corona radiata hypodensities favor chronic ischemic microvascular white matter disease. No intracranial hemorrhage, mass lesion, or acute CVA. Cerebral atrophy. Vascular: Unremarkable Skull: Unremarkable Sinuses/Orbits: Unremarkable Other: No supplemental non-categorized findings. CT CERVICAL SPINE FINDINGS Alignment: 2.5 mm degenerative anterolisthesis at C4-5, no change from 04/25/2016. Skull base and vertebrae: No fracture or acute subluxation is identified. Extensive multilevel degenerative facet  arthropathy. Loss of disc height at all levels between C5 and T2. Soft tissues and spinal canal: Unremarkable Disc levels: Osseous foraminal stenosis on the left that C3-4, C4-5, and C5-6 due to facet and uncinate spurring. Upper chest: Aortic arch atherosclerotic calcification. Other: No supplemental non-categorized findings. IMPRESSION: 1. No acute intracranial findings or acute cervical spine findings. 2. Periventricular white matter and corona radiata hypodensities favor chronic ischemic microvascular white matter disease. 3. Considerable cervical spondylosis and degenerative disc disease. 4. Aortic arch atherosclerosis. Electronically Signed   By: Gaylyn RongWalter  Liebkemann M.D.   On: 05/17/2016 21:47   Dg Chest Port 1 View  Result Date: 05/17/2016 CLINICAL DATA:  Altered mental status, unwitnessed fall. Dementia. History of hypertension, diabetes, bradycardia and former smoker. EXAM: PORTABLE CHEST 1 VIEW COMPARISON:  03/16/2016 FINDINGS: The heart is top-normal in size. The aorta is tortuous and atherosclerotic. Right atrial and right ventricular pacing wires are noted with left-sided pacemaker apparatus projecting over the left axilla. Lungs are free of pneumonic consolidations, CHF and effusions. No pneumothoraces. No acute osseous abnormality. The patient's chin obscures the medial lung apices. IMPRESSION: No active disease. Electronically Signed   By: Onalee Huaavid  Sterling BigKwon M.D.   On: 05/17/2016 21:45     Assessment/Plan Principal Problem:   SIRS (systemic inflammatory response syndrome) (HCC) Active Problems:   Paroxysmal atrial tachycardia (HCC)   SSS (sick sinus syndrome) (HCC)   Pacemaker   Hypokalemia    1. Possible SIRS - given the tachycardia, fever and leukocytosis at presentation patient has been empirically placed on antibiotics. Follow cultures, influenza PCR and observe overnight. 2. History of paroxysmal atrial tachycardia on Cardizem. 3. Dementia - no acute issues at this  time. 4. Hyperglycemia - check hemoglobin A1c. 5. History of sick sinus syndrome status post pacemaker placement.  X-ray pelvis is pending.  DVT prophylaxis: Lovenox. Code Status: DO NOT RESUSCITATE.  Family Communication: No family at bedside. ER physician had discussed with healthcare power of attorney.  Disposition Plan: Skilled nursing facility.  Consults called: None.  Admission status: Observation.    Eduard ClosKAKRAKANDY,Glendine Swetz N. MD [facility] Pager [phone].  If 7PM-7AM, please contact night-coverage [email or web address] Password [email or web address]  05/18/2016, 1:43 AM

## 2016-05-18 NOTE — Progress Notes (Addendum)
PROGRESS NOTE  Paige Flores  NWG:[id number] DOB: Dec 09, 1940 DOA: 05/17/2016 PCP: Ron Parker, MD  Brief Narrative:  Paige Flores is a 75 y.o. female with advanced dementia, sick sinus syndrome status post pacemaker placement, paroxysmal atrial tachycardia was brought to the ER, after patient had an unwitnessed fall at her nursing facility. Patient is noncommunicative at baseline and bed and wheelchair bound.  She has lost 35-lbs since September of this year per our records and is frail appearing.  She was found to be febrile and tachycardic in the ER.  CXR and UA were unremarkable.  Flu PCR negative.  Cultures are pending and she is on empiric antibiotics.  Pelvic XR demonstrates right femoral neck fracture.  Spoke with daughter about findings and will discuss case with orthopedic surgeon.     Assessment & Plan:   Principal Problem:   Fracture of femoral neck, right (HCC) Active Problems:   Paroxysmal atrial tachycardia (HCC)   SSS (sick sinus syndrome) (HCC)   Pacemaker   SIRS (systemic inflammatory response syndrome) (HCC)   Protein-calorie malnutrition, severe (HCC)   Hypokalemia  Unwitnessed fall with right hip fracture -  Orthopedic surgery consult -  Given dementia and frailty, moderate to high risk for surgery particularly given   Dementia, severe, and end-stage with weight loss -  Given weight loss and functional status, particularly now after a hip fracture, candidate for hospice care -  SLP evaluation -  Aspiration precautions -  Dysphagia 1 diet  Severe protein calorie malnutrition -  Nutrition consultation -  supplements  SIRS, unclear etiology -  Continue broad spectrum antibiotics until cultures negative for 24-48 hours  Constipation, seen on pelvic X-ray, resolving  -  Multiple BMs since admission -  Start stool softeners  Paroxysmal atrial tachycardia and sick sinus syndrome s/p PPM insertion -  pateint refusing cardizem  Moderate aortic valve  stenosis with evidence of LVH and diastolic dysfunction but preserved EF.     DVT prophylaxis:  lovenox Code Status:  DNR Family Communication:  Patient and her daughter Ms. Frey Disposition Plan:  Inpatient    Consultants:   Orthopedic surgery, Dr. Linna Caprice  Procedures:  none  Antimicrobials:  Anti-infectives    Start     Dose/Rate Route Frequency Ordered Stop   05/18/16 1200  vancomycin (VANCOCIN) 500 mg in sodium chloride 0.9 % 100 mL IVPB     500 mg 100 mL/hr over 60 Minutes Intravenous Every 12 hours 05/18/16 0318     05/18/16 0600  piperacillin-tazobactam (ZOSYN) IVPB 3.375 g     3.375 g 12.5 mL/hr over 240 Minutes Intravenous Every 8 hours 05/18/16 0318     05/17/16 2130  piperacillin-tazobactam (ZOSYN) IVPB 3.375 g     3.375 g 100 mL/hr over 30 Minutes Intravenous  Once 05/17/16 2115 05/17/16 2232   05/17/16 2130  vancomycin (VANCOCIN) IVPB 1000 mg/200 mL premix     1,000 mg 200 mL/hr over 60 Minutes Intravenous  Once 05/17/16 2115 05/17/16 2302       Subjective: Nonverbal  Objective: Vitals:   05/17/16 2230 05/18/16 0040 05/18/16 0500 05/18/16 0658  BP: 116/73 122/65 118/70   Pulse: 73 70 82   Resp: 18 18 18    Temp:  98.7 F (37.1 C) 97.7 F (36.5 C)   TempSrc:  Oral Oral   SpO2: 100% 100% 99%   Weight:    43.5 kg (95 lb 14.4 oz)    Intake/Output Summary (Last 24 hours) at 05/18/16 1415 Last data filed  at 05/18/16 1034  Gross per 24 hour  Intake             2025 ml  Output                0 ml  Net             2025 ml   Filed Weights   05/17/16 2112 05/18/16 0658  Weight: 50.7 kg (111 lb 12.4 oz) 43.5 kg (95 lb 14.4 oz)    Examination:  General exam:  Cachectic female, grinding teeth, left left side in fetal position.  No acute distress.  HEENT:  NCAT, MMM Respiratory system: Clear to auscultation bilaterally Cardiovascular system: Regular rate and rhythm, normal S1/S2.  2/6 systolic murmur.  Warm extremities Gastrointestinal system:  Normal active bowel sounds, soft, nondistended, nontender. MSK:  Decreased tone and bulk, no lower extremity edema.  Does not appear to have pain with palpation of right lateral hip but refuses to allow me to strengthen either leg.  Feet are warm and well perfused, less than 2 sec CR, 2+ pedal pulses Neuro:  Grossly moves bilateral feet.      Data Reviewed: I have personally reviewed following labs and imaging studies  CBC:  Recent Labs Lab 05/17/16 2148 05/18/16 0609  WBC 13.8* 11.8*  NEUTROABS 9.8*  --   HGB 14.2 12.6  HCT 43.5 38.3  MCV 90.2 89.9  PLT 366 320   Basic Metabolic Panel:  Recent Labs Lab 05/17/16 2148 05/18/16 0609  NA 142 141  K 3.1* 3.8  CL 105 109  CO2 28 25  GLUCOSE 180* 131*  BUN 14 9  CREATININE 0.72 0.63  CALCIUM 9.1 8.2*   GFR: Estimated Creatinine Clearance: 38 mL/min (by C-G formula based on SCr of 0.63 mg/dL). Liver Function Tests:  Recent Labs Lab 05/17/16 2148  AST 17  ALT 15  ALKPHOS 113  BILITOT 0.6  PROT 7.6  ALBUMIN 3.4*   No results for input(s): LIPASE, AMYLASE in the last 168 hours. No results for input(s): AMMONIA in the last 168 hours. Coagulation Profile: No results for input(s): INR, PROTIME in the last 168 hours. Cardiac Enzymes: No results for input(s): CKTOTAL, CKMB, CKMBINDEX, TROPONINI in the last 168 hours. BNP (last 3 results) No results for input(s): PROBNP in the last 8760 hours. HbA1C: No results for input(s): HGBA1C in the last 72 hours. CBG: No results for input(s): GLUCAP in the last 168 hours. Lipid Profile: No results for input(s): CHOL, HDL, LDLCALC, TRIG, CHOLHDL, LDLDIRECT in the last 72 hours. Thyroid Function Tests: No results for input(s): TSH, T4TOTAL, FREET4, T3FREE, THYROIDAB in the last 72 hours. Anemia Panel: No results for input(s): VITAMINB12, FOLATE, FERRITIN, TIBC, IRON, RETICCTPCT in the last 72 hours. Urine analysis:    Component Value Date/Time   COLORURINE AMBER (A)  05/17/2016 2132   APPEARANCEUR CLEAR 05/17/2016 2132   LABSPEC 1.026 05/17/2016 2132   PHURINE 5.0 05/17/2016 2132   GLUCOSEU >=500 (A) 05/17/2016 2132   HGBUR NEGATIVE 05/17/2016 2132   BILIRUBINUR NEGATIVE 05/17/2016 2132   KETONESUR NEGATIVE 05/17/2016 2132   PROTEINUR 30 (A) 05/17/2016 2132   NITRITE NEGATIVE 05/17/2016 2132   LEUKOCYTESUR NEGATIVE 05/17/2016 2132   Sepsis Labs: @LABRCNTIP (procalcitonin:4,lacticidven:4)  ) Recent Results (from the past 240 hour(s))  MRSA PCR Screening     Status: None   Collection Time: 05/18/16  2:20 AM  Result Value Ref Range Status   MRSA by PCR NEGATIVE NEGATIVE Final  Comment:        The GeneXpert MRSA Assay (FDA approved for NASAL specimens only), is one component of a comprehensive MRSA colonization surveillance program. It is not intended to diagnose MRSA infection nor to guide or monitor treatment for MRSA infections.       Radiology Studies: Ct Head Wo Contrast  Result Date: 05/17/2016 CLINICAL DATA:  Vascular dementia. Unwitnessed fall. Bruised along the forehead. EXAM: CT HEAD WITHOUT CONTRAST CT CERVICAL SPINE WITHOUT CONTRAST TECHNIQUE: Multidetector CT imaging of the head and cervical spine was performed following the standard protocol without intravenous contrast. Multiplanar CT image reconstructions of the cervical spine were also generated. COMPARISON:  04/25/2016 FINDINGS: Despite efforts by the technologist and patient, motion artifact is present on today's exam and could not be eliminated. This reduces exam sensitivity and specificity. CT HEAD FINDINGS Brain: The brainstem, cerebellum, cerebral peduncles, thalami, basal ganglia, basilar cisterns, and ventricular system appear within normal limits. Periventricular white matter and corona radiata hypodensities favor chronic ischemic microvascular white matter disease. No intracranial hemorrhage, mass lesion, or acute CVA. Cerebral atrophy. Vascular: Unremarkable Skull:  Unremarkable Sinuses/Orbits: Unremarkable Other: No supplemental non-categorized findings. CT CERVICAL SPINE FINDINGS Alignment: 2.5 mm degenerative anterolisthesis at C4-5, no change from 04/25/2016. Skull base and vertebrae: No fracture or acute subluxation is identified. Extensive multilevel degenerative facet arthropathy. Loss of disc height at all levels between C5 and T2. Soft tissues and spinal canal: Unremarkable Disc levels: Osseous foraminal stenosis on the left that C3-4, C4-5, and C5-6 due to facet and uncinate spurring. Upper chest: Aortic arch atherosclerotic calcification. Other: No supplemental non-categorized findings. IMPRESSION: 1. No acute intracranial findings or acute cervical spine findings. 2. Periventricular white matter and corona radiata hypodensities favor chronic ischemic microvascular white matter disease. 3. Considerable cervical spondylosis and degenerative disc disease. 4. Aortic arch atherosclerosis. Electronically Signed   By: Gaylyn RongWalter  Liebkemann M.D.   On: 05/17/2016 21:47   Ct Cervical Spine Wo Contrast  Result Date: 05/17/2016 CLINICAL DATA:  Vascular dementia. Unwitnessed fall. Bruised along the forehead. EXAM: CT HEAD WITHOUT CONTRAST CT CERVICAL SPINE WITHOUT CONTRAST TECHNIQUE: Multidetector CT imaging of the head and cervical spine was performed following the standard protocol without intravenous contrast. Multiplanar CT image reconstructions of the cervical spine were also generated. COMPARISON:  04/25/2016 FINDINGS: Despite efforts by the technologist and patient, motion artifact is present on today's exam and could not be eliminated. This reduces exam sensitivity and specificity. CT HEAD FINDINGS Brain: The brainstem, cerebellum, cerebral peduncles, thalami, basal ganglia, basilar cisterns, and ventricular system appear within normal limits. Periventricular white matter and corona radiata hypodensities favor chronic ischemic microvascular white matter disease. No  intracranial hemorrhage, mass lesion, or acute CVA. Cerebral atrophy. Vascular: Unremarkable Skull: Unremarkable Sinuses/Orbits: Unremarkable Other: No supplemental non-categorized findings. CT CERVICAL SPINE FINDINGS Alignment: 2.5 mm degenerative anterolisthesis at C4-5, no change from 04/25/2016. Skull base and vertebrae: No fracture or acute subluxation is identified. Extensive multilevel degenerative facet arthropathy. Loss of disc height at all levels between C5 and T2. Soft tissues and spinal canal: Unremarkable Disc levels: Osseous foraminal stenosis on the left that C3-4, C4-5, and C5-6 due to facet and uncinate spurring. Upper chest: Aortic arch atherosclerotic calcification. Other: No supplemental non-categorized findings. IMPRESSION: 1. No acute intracranial findings or acute cervical spine findings. 2. Periventricular white matter and corona radiata hypodensities favor chronic ischemic microvascular white matter disease. 3. Considerable cervical spondylosis and degenerative disc disease. 4. Aortic arch atherosclerosis. Electronically Signed   By: Gaylyn RongWalter  Liebkemann  M.D.   On: 05/17/2016 21:47   Dg Pelvis Portable  Result Date: 05/18/2016 CLINICAL DATA:  Unwitnessed fall.  Dementia. EXAM: PORTABLE PELVIS 1-2 VIEWS COMPARISON:  None. FINDINGS: Single view of the pelvis. Large amount of ascending colonic and cecal stool. Femoral heads are located. Sacroiliac joints are grossly symmetric. Apparent deformity of the right femoral head/ neck junction. IMPRESSION: Apparent deformity of the right femoral head/neck junction. Suspicious for fracture. correlate with patient's area of tenderness and consider dedicated right hip radiographs. Electronically Signed   By: Jeronimo GreavesKyle  Talbot M.D.   On: 05/18/2016 08:59   Dg Chest Port 1 View  Result Date: 05/17/2016 CLINICAL DATA:  Altered mental status, unwitnessed fall. Dementia. History of hypertension, diabetes, bradycardia and former smoker. EXAM: PORTABLE CHEST  1 VIEW COMPARISON:  03/16/2016 FINDINGS: The heart is top-normal in size. The aorta is tortuous and atherosclerotic. Right atrial and right ventricular pacing wires are noted with left-sided pacemaker apparatus projecting over the left axilla. Lungs are free of pneumonic consolidations, CHF and effusions. No pneumothoraces. No acute osseous abnormality. The patient's chin obscures the medial lung apices. IMPRESSION: No active disease. Electronically Signed   By: Tollie Ethavid  Kwon M.D.   On: 05/17/2016 21:45     Scheduled Meds: . diltiazem  180 mg Oral Daily  . enoxaparin (LOVENOX) injection  30 mg Subcutaneous Q24H  . escitalopram  10 mg Oral Daily  . piperacillin-tazobactam (ZOSYN)  IV  3.375 g Intravenous Q8H  . vancomycin  500 mg Intravenous Q12H   Continuous Infusions:    LOS: 0 days    Time spent: 30 min    Renae FickleSHORT, Keyatta Tolles, MD [facility] Pager [phone]  If 7PM-7AM, please contact night-coverage [email or web address] Password [email or web address] 05/18/2016, 2:15 PM

## 2016-05-18 NOTE — Progress Notes (Signed)
Pt arrived to room 1515. Pt is alert, nonverbal. Skin intact. Has left IV site SL. No family at bedside and unable to complete admission assessment. Will pass to day shift to complete if family available. Will review orders and continue to monitor.

## 2016-05-19 ENCOUNTER — Inpatient Hospital Stay (HOSPITAL_COMMUNITY): Payer: Medicare Other

## 2016-05-19 DIAGNOSIS — E43 Unspecified severe protein-calorie malnutrition: Secondary | ICD-10-CM

## 2016-05-19 LAB — BASIC METABOLIC PANEL
Anion gap: 10 (ref 5–15)
BUN: 9 mg/dL (ref 6–20)
CHLORIDE: 107 mmol/L (ref 101–111)
CO2: 23 mmol/L (ref 22–32)
CREATININE: 0.68 mg/dL (ref 0.44–1.00)
Calcium: 8.2 mg/dL — ABNORMAL LOW (ref 8.9–10.3)
GFR calc Af Amer: >60 mL/min (ref >60)
GLUCOSE: 116 mg/dL — AB (ref 65–99)
POTASSIUM: 3.1 mmol/L — AB (ref 3.5–5.1)
Sodium: 140 mmol/L (ref 135–145)

## 2016-05-19 LAB — HEMOGLOBIN A1C
HEMOGLOBIN A1C: 6.5 % — AB (ref 4.8–5.6)
MEAN PLASMA GLUCOSE: 140 mg/dL

## 2016-05-19 LAB — CBC
HEMATOCRIT: 35 % — AB (ref 36.0–46.0)
Hemoglobin: 11.5 g/dL — ABNORMAL LOW (ref 12.0–15.0)
MCH: 29.6 pg (ref 26.0–34.0)
MCHC: 32.9 g/dL (ref 30.0–36.0)
MCV: 90.2 fL (ref 78.0–100.0)
PLATELETS: 312 10*3/uL (ref 150–400)
RBC: 3.88 MIL/uL (ref 3.87–5.11)
RDW: 14.4 % (ref 11.5–15.5)
WBC: 12.9 10*3/uL — ABNORMAL HIGH (ref 4.0–10.5)

## 2016-05-19 LAB — URINE CULTURE: CULTURE: NO GROWTH

## 2016-05-19 MED ORDER — MORPHINE SULFATE (CONCENTRATE) 10 MG/0.5ML PO SOLN: 5.0000 mg | ORAL | Status: DC | PRN

## 2016-05-19 MED ORDER — BISACODYL 10 MG RE SUPP
20.0000 mg | Freq: Once | RECTAL | Status: DC
  Filled 2016-05-19: qty 2

## 2016-05-19 MED ORDER — POLYETHYLENE GLYCOL 3350 17 G PO PACK
17.0000 g | PACK | Freq: Two times a day (BID) | ORAL | Status: DC
  Administered 2016-05-19 – 2016-05-20 (×3): 17 g via ORAL
  Filled 2016-05-19 (×3): qty 1

## 2016-05-19 MED ORDER — BISACODYL 5 MG PO TBEC
5.0000 mg | DELAYED_RELEASE_TABLET | Freq: Once | ORAL | Status: AC
Start: 2016-05-19 — End: 2016-05-19
  Administered 2016-05-19: 5 mg via ORAL
  Filled 2016-05-19: qty 1

## 2016-05-19 NOTE — Evaluation (Signed)
Clinical/Bedside Swallow Evaluation Patient Details  Name: Paige Flores MRN: [id number] Date of Birth: March 14, 1941  Today's Date: 05/19/2016 Time: SLP Start Time (ACUTE ONLY): 1100 SLP Stop Time (ACUTE ONLY): 1125 SLP Time Calculation (min) (ACUTE ONLY): 25 min  Past Medical History:  Past Medical History:  Diagnosis Date  . Aphasia   . Bradycardia   . Dementia    vascular  . Depression   . Diabetes mellitus without complication (HCC)   . Hypertension   . 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 PACEMAKER INSERTION  [id number]   HPI:  75 year old female admitted 05/17/16 with right femoral neck fracture. PMH significant for severe dementia, aphasia. Pt is nonverbal at baseline. BSE ordered to evaluate swallow function and safety.   Assessment / Plan / Recommendation Clinical Impression  Pt was seen at bedside during breakfast. She was observed to grind her teeth together when not being fed. Pt was unable to follow commands for complete evaluation of oral motor strength and function, however, no significant unilateral weakness, no anterior leakage of any consistency, and no oral residue was noted. Pt is currently receiving puree diet and thin liquids, and accepts small bites off the end of the utensil. Oral prep and propulsion appear timely, as does laryngeal elevation. Recommend continuing with current diet with 1:1 supervision/feeding assistance. Pt may be appropriate for Dys 2 or Dys 3 solids, however, she declined presentation of these consistencies at this time. SLP will follow for trials of advanced solid textures, to determine appropriateness to advance diet. Meds should be crushed in puree. Oral care QID. [facility]    Aspiration Risk  Mild aspiration risk    Diet Recommendation Dysphagia 1 (Puree);Thin liquid   Liquid Administration via: Straw Medication Administration: Crushed with  puree Supervision: Staff to assist with self feeding;Full supervision/cueing for compensatory strategies Compensations: Minimize environmental distractions;Slow rate;Small sips/bites;Follow solids with liquid (check oral cavity between bites and after meals for pocketing.) Postural Changes: Seated upright at 90 degrees;Remain upright for at least 30 minutes after po intake    Other  Recommendations Oral Care Recommendations: Oral care QID;Staff/trained caregiver to provide oral care   Follow up Recommendations 24 hour supervision/assistance      Frequency and Duration min 1 x/week  2 weeks       Prognosis Prognosis for Safe Diet Advancement: Fair Barriers to Reach Goals: Cognitive deficits      Swallow Study   General Date of Onset: 05/17/16 HPI: 75 year old female admitted 05/17/16 with right femoral neck fracture. PMH significant for severe dementia, aphasia. Pt is nonverbal at baseline. BSE ordered to evaluate swallow function and safety. Type of Study: Bedside Swallow Evaluation Previous Swallow Assessment: BSE 03/19/16 recommended Dys 3, thin liquids. Diet Prior to this Study: Dysphagia 1 (puree);Thin liquids Temperature Spikes Noted: No Respiratory Status: Room air History of Recent Intubation: No Behavior/Cognition: Alert;Cooperative;Pleasant mood;Confused;Doesn't follow directions Oral Cavity Assessment: Within Functional Limits Oral Care Completed by SLP: No Oral Cavity - Dentition: Missing dentition Vision: Functional for self-feeding Self-Feeding Abilities: Total assist Patient Positioning: Upright in bed Baseline Vocal Quality: Normal (minimal vocalizations elicited.) Volitional Cough: Cognitively unable to elicit Volitional Swallow: Unable to elicit    Oral/Motor/Sensory Function Overall Oral Motor/Sensory Function: Within functional limits   Ice Chips Ice chips: Not tested   Thin Liquid Thin Liquid: Within functional limits Presentation: Straw    Nectar  Thick Nectar Thick Liquid:  Not tested   Honey Thick Honey Thick Liquid: Not tested   Puree Puree: Within functional limits Presentation: Spoon   Solid   GO   Solid: Not tested Other Comments: pt would not accept at this time       Paige Flores, MSP, CCC-SLP [phone]  Paige Flores, Paige Flores 05/19/2016,11:26 AM

## 2016-05-19 NOTE — Progress Notes (Addendum)
PROGRESS NOTE  Paige Flores  JXB:[id number] DOB: February 19, 1941 DOA: 05/17/2016 PCP: Ron Parker, MD  Brief Narrative:  Paige Flores is a 75 y.o. female with advanced dementia, sick sinus syndrome status post pacemaker placement, paroxysmal atrial tachycardia was brought to the ER, after patient had an unwitnessed fall at her nursing facility. Patient is noncommunicative at baseline and bed and wheelchair bound.  She has lost 35-lbs since September of this year per our records and is frail appearing.  She was found to be febrile and tachycardic in the ER.  CXR and UA were unremarkable.  Flu PCR negative.  Cultures are pending and she is on empiric antibiotics.  Pelvic XR demonstrates right femoral neck fracture.  Due to patient's frailty, family and orthopedic surgery agreed to no surgery.  Awaiting culture data, but if negative, could likely stop antibiotics and discharge back to SNF on Wednesday.    Assessment & Plan:   Principal Problem:   Fracture of femoral neck, right (HCC) Active Problems:   Paroxysmal atrial tachycardia (HCC)   SSS (sick sinus syndrome) (HCC)   Pacemaker   SIRS (systemic inflammatory response syndrome) (HCC)   Protein-calorie malnutrition, severe (HCC)   Hypokalemia   Contusion of face   Fall  Unwitnessed fall with right hip fracture -  Orthopedic surgery consult appreciated  Dementia, severe, and end-stage with weight loss -  Given weight loss and functional status, particularly now after a hip fracture, candidate for hospice care -  SLP evaluation apprecaited -  Aspiration precautions -  Dysphagia 1 diet with thin  Severe protein calorie malnutrition -  Nutrition consultation -  supplements  SIRS (fever 101.1, tachycardic, tachypneic) unclear etiology -  Continue broad spectrum antibiotics until blood cultures negative for 24-48 hours -  Urine culture neg  Constipation, seen on pelvic X-ray, and no improvement on XR today despite several BMs -   Continue stool softeners  Paroxysmal atrial tachycardia and sick sinus syndrome s/p PPM insertion -  pateint refusing cardizem  Moderate aortic valve stenosis with evidence of LVH and diastolic dysfunction but preserved EF.    Hypokalemia, oral potassium repletion  DVT prophylaxis:  lovenox Code Status:  DNR Family Communication:  Patient alone today Disposition Plan:  Inpatient    Consultants:   Orthopedic surgery, Dr. Linna Caprice  Procedures:  none  Antimicrobials:  Anti-infectives    Start     Dose/Rate Route Frequency Ordered Stop   05/18/16 1200  vancomycin (VANCOCIN) 500 mg in sodium chloride 0.9 % 100 mL IVPB  Status:  Discontinued     500 mg 100 mL/hr over 60 Minutes Intravenous Every 12 hours 05/18/16 0318 05/18/16 1528   05/18/16 0600  piperacillin-tazobactam (ZOSYN) IVPB 3.375 g     3.375 g 12.5 mL/hr over 240 Minutes Intravenous Every 8 hours 05/18/16 0318     05/17/16 2130  piperacillin-tazobactam (ZOSYN) IVPB 3.375 g     3.375 g 100 mL/hr over 30 Minutes Intravenous  Once 05/17/16 2115 05/17/16 2232   05/17/16 2130  vancomycin (VANCOCIN) IVPB 1000 mg/200 mL premix     1,000 mg 200 mL/hr over 60 Minutes Intravenous  Once 05/17/16 2115 05/17/16 2302       Subjective: Nonverbal  Objective: Vitals:   05/18/16 1434 05/18/16 2018 05/19/16 0450 05/19/16 1538  BP: 114/73 109/72 104/73 111/82  Pulse: 93 92 67 77  Resp: 14 16 16 18   Temp: 98.4 F (36.9 C) 98 F (36.7 C) 97.8 F (36.6 C) 97.9 F (36.6 C)  TempSrc: Axillary Axillary Tympanic Oral  SpO2: 97% 97% 97% 97%  Weight:        Intake/Output Summary (Last 24 hours) at 05/19/16 1917 Last data filed at 05/19/16 1430  Gross per 24 hour  Intake              341 ml  Output                0 ml  Net              341 ml   Filed Weights   05/17/16 2112 05/18/16 0658  Weight: 50.7 kg (111 lb 12.4 oz) 43.5 kg (95 lb 14.4 oz)    Examination:  General exam:  Cachectic female, grinding teeth, sitting  up smiling.  No acute distress.  HEENT:  NCAT, MMM Respiratory system: Clear to auscultation bilaterally Cardiovascular system: Regular rate and rhythm, normal S1/S2.  2/6 systolic murmur.  Warm extremities Gastrointestinal system: Normal active bowel sounds, soft, nondistended, nontender. MSK:  Decreased tone and bulk, no lower extremity edema.  Does not appear to have pain with palpation of right lateral hip.  Feet are warm and well perfused, less than 2 sec CR, 2+ pedal pulses Neuro:  Grossly moves bilateral feet.      Data Reviewed: I have personally reviewed following labs and imaging studies  CBC:  Recent Labs Lab 05/17/16 2148 05/18/16 0609 05/19/16 0537  WBC 13.8* 11.8* 12.9*  NEUTROABS 9.8*  --   --   HGB 14.2 12.6 11.5*  HCT 43.5 38.3 35.0*  MCV 90.2 89.9 90.2  PLT 366 320 312   Basic Metabolic Panel:  Recent Labs Lab 05/17/16 2148 05/18/16 0609 05/19/16 0537  NA 142 141 140  K 3.1* 3.8 3.1*  CL 105 109 107  CO2 28 25 23   GLUCOSE 180* 131* 116*  BUN 14 9 9   CREATININE 0.72 0.63 0.68  CALCIUM 9.1 8.2* 8.2*   GFR: Estimated Creatinine Clearance: 38 mL/min (by C-G formula based on SCr of 0.68 mg/dL). Liver Function Tests:  Recent Labs Lab 05/17/16 2148  AST 17  ALT 15  ALKPHOS 113  BILITOT 0.6  PROT 7.6  ALBUMIN 3.4*   No results for input(s): LIPASE, AMYLASE in the last 168 hours. No results for input(s): AMMONIA in the last 168 hours. Coagulation Profile: No results for input(s): INR, PROTIME in the last 168 hours. Cardiac Enzymes: No results for input(s): CKTOTAL, CKMB, CKMBINDEX, TROPONINI in the last 168 hours. BNP (last 3 results) No results for input(s): PROBNP in the last 8760 hours. HbA1C:  Recent Labs  05/18/16 0609  HGBA1C 6.5*   CBG: No results for input(s): GLUCAP in the last 168 hours. Lipid Profile: No results for input(s): CHOL, HDL, LDLCALC, TRIG, CHOLHDL, LDLDIRECT in the last 72 hours. Thyroid Function Tests: No  results for input(s): TSH, T4TOTAL, FREET4, T3FREE, THYROIDAB in the last 72 hours. Anemia Panel: No results for input(s): VITAMINB12, FOLATE, FERRITIN, TIBC, IRON, RETICCTPCT in the last 72 hours. Urine analysis:    Component Value Date/Time   COLORURINE AMBER (A) 05/17/2016 2132   APPEARANCEUR CLEAR 05/17/2016 2132   LABSPEC 1.026 05/17/2016 2132   PHURINE 5.0 05/17/2016 2132   GLUCOSEU >=500 (A) 05/17/2016 2132   HGBUR NEGATIVE 05/17/2016 2132   BILIRUBINUR NEGATIVE 05/17/2016 2132   KETONESUR NEGATIVE 05/17/2016 2132   PROTEINUR 30 (A) 05/17/2016 2132   NITRITE NEGATIVE 05/17/2016 2132   LEUKOCYTESUR NEGATIVE 05/17/2016 2132   Sepsis Labs: @LABRCNTIP (procalcitonin:4,lacticidven:4)  )  Recent Results (from the past 240 hour(s))  Blood Culture (routine x 2)     Status: None (Preliminary result)   Collection Time: 05/17/16  9:18 PM  Result Value Ref Range Status   Specimen Description BLOOD RIGHT WRIST  Final   Special Requests BOTTLES DRAWN AEROBIC AND ANAEROBIC 5 CC  Final   Culture   Final    NO GROWTH 1 DAY Performed at [facility]    Report Status PENDING  Incomplete  Urine culture     Status: None   Collection Time: 05/17/16  9:32 PM  Result Value Ref Range Status   Specimen Description URINE, CATHETERIZED  Final   Special Requests NONE  Final   Culture NO GROWTH Performed at [facility]   Final   Report Status 05/19/2016 FINAL  Final  Blood Culture (routine x 2)     Status: None (Preliminary result)   Collection Time: 05/17/16  9:48 PM  Result Value Ref Range Status   Specimen Description BLOOD RIGHT ANTECUBITAL  Final   Special Requests BOTTLES DRAWN AEROBIC AND ANAEROBIC 6 CC  Final   Culture   Final    NO GROWTH 1 DAY Performed at [facility]    Report Status PENDING  Incomplete  MRSA PCR Screening     Status: None   Collection Time: 05/18/16  2:20 AM  Result Value Ref Range Status   MRSA by PCR NEGATIVE NEGATIVE Final     Comment:        The GeneXpert MRSA Assay (FDA approved for NASAL specimens only), is one component of a comprehensive MRSA colonization surveillance program. It is not intended to diagnose MRSA infection nor to guide or monitor treatment for MRSA infections.       Radiology Studies: Ct Head Wo Contrast  Result Date: 05/17/2016 CLINICAL DATA:  Vascular dementia. Unwitnessed fall. Bruised along the forehead. EXAM: CT HEAD WITHOUT CONTRAST CT CERVICAL SPINE WITHOUT CONTRAST TECHNIQUE: Multidetector CT imaging of the head and cervical spine was performed following the standard protocol without intravenous contrast. Multiplanar CT image reconstructions of the cervical spine were also generated. COMPARISON:  04/25/2016 FINDINGS: Despite efforts by the technologist and patient, motion artifact is present on today's exam and could not be eliminated. This reduces exam sensitivity and specificity. CT HEAD FINDINGS Brain: The brainstem, cerebellum, cerebral peduncles, thalami, basal ganglia, basilar cisterns, and ventricular system appear within normal limits. Periventricular white matter and corona radiata hypodensities favor chronic ischemic microvascular white matter disease. No intracranial hemorrhage, mass lesion, or acute CVA. Cerebral atrophy. Vascular: Unremarkable Skull: Unremarkable Sinuses/Orbits: Unremarkable Other: No supplemental non-categorized findings. CT CERVICAL SPINE FINDINGS Alignment: 2.5 mm degenerative anterolisthesis at C4-5, no change from 04/25/2016. Skull base and vertebrae: No fracture or acute subluxation is identified. Extensive multilevel degenerative facet arthropathy. Loss of disc height at all levels between C5 and T2. Soft tissues and spinal canal: Unremarkable Disc levels: Osseous foraminal stenosis on the left that C3-4, C4-5, and C5-6 due to facet and uncinate spurring. Upper chest: Aortic arch atherosclerotic calcification. Other: No supplemental non-categorized  findings. IMPRESSION: 1. No acute intracranial findings or acute cervical spine findings. 2. Periventricular white matter and corona radiata hypodensities favor chronic ischemic microvascular white matter disease. 3. Considerable cervical spondylosis and degenerative disc disease. 4. Aortic arch atherosclerosis. Electronically Signed   By: Gaylyn Rong M.D.   On: 05/17/2016 21:47   Ct Cervical Spine Wo Contrast  Result Date: 05/17/2016 CLINICAL DATA:  Vascular dementia. Unwitnessed fall.  Bruised along the forehead. EXAM: CT HEAD WITHOUT CONTRAST CT CERVICAL SPINE WITHOUT CONTRAST TECHNIQUE: Multidetector CT imaging of the head and cervical spine was performed following the standard protocol without intravenous contrast. Multiplanar CT image reconstructions of the cervical spine were also generated. COMPARISON:  04/25/2016 FINDINGS: Despite efforts by the technologist and patient, motion artifact is present on today's exam and could not be eliminated. This reduces exam sensitivity and specificity. CT HEAD FINDINGS Brain: The brainstem, cerebellum, cerebral peduncles, thalami, basal ganglia, basilar cisterns, and ventricular system appear within normal limits. Periventricular white matter and corona radiata hypodensities favor chronic ischemic microvascular white matter disease. No intracranial hemorrhage, mass lesion, or acute CVA. Cerebral atrophy. Vascular: Unremarkable Skull: Unremarkable Sinuses/Orbits: Unremarkable Other: No supplemental non-categorized findings. CT CERVICAL SPINE FINDINGS Alignment: 2.5 mm degenerative anterolisthesis at C4-5, no change from 04/25/2016. Skull base and vertebrae: No fracture or acute subluxation is identified. Extensive multilevel degenerative facet arthropathy. Loss of disc height at all levels between C5 and T2. Soft tissues and spinal canal: Unremarkable Disc levels: Osseous foraminal stenosis on the left that C3-4, C4-5, and C5-6 due to facet and uncinate  spurring. Upper chest: Aortic arch atherosclerotic calcification. Other: No supplemental non-categorized findings. IMPRESSION: 1. No acute intracranial findings or acute cervical spine findings. 2. Periventricular white matter and corona radiata hypodensities favor chronic ischemic microvascular white matter disease. 3. Considerable cervical spondylosis and degenerative disc disease. 4. Aortic arch atherosclerosis. Electronically Signed   By: Gaylyn Rong M.D.   On: 05/17/2016 21:47   Dg Pelvis Portable  Result Date: 05/18/2016 CLINICAL DATA:  Unwitnessed fall.  Dementia. EXAM: PORTABLE PELVIS 1-2 VIEWS COMPARISON:  None. FINDINGS: Single view of the pelvis. Large amount of ascending colonic and cecal stool. Femoral heads are located. Sacroiliac joints are grossly symmetric. Apparent deformity of the right femoral head/ neck junction. IMPRESSION: Apparent deformity of the right femoral head/neck junction. Suspicious for fracture. correlate with patient's area of tenderness and consider dedicated right hip radiographs. Electronically Signed   By: Jeronimo Greaves M.D.   On: 05/18/2016 08:59   Dg Chest Port 1 View  Result Date: 05/17/2016 CLINICAL DATA:  Altered mental status, unwitnessed fall. Dementia. History of hypertension, diabetes, bradycardia and former smoker. EXAM: PORTABLE CHEST 1 VIEW COMPARISON:  03/16/2016 FINDINGS: The heart is top-normal in size. The aorta is tortuous and atherosclerotic. Right atrial and right ventricular pacing wires are noted with left-sided pacemaker apparatus projecting over the left axilla. Lungs are free of pneumonic consolidations, CHF and effusions. No pneumothoraces. No acute osseous abnormality. The patient's chin obscures the medial lung apices. IMPRESSION: No active disease. Electronically Signed   By: Tollie Eth M.D.   On: 05/17/2016 21:45   Dg Abd Portable 1v  Result Date: 05/19/2016 CLINICAL DATA:  Constipation EXAM: PORTABLE ABDOMEN - 1 VIEW  COMPARISON:  05/18/2016 FINDINGS: Moderate stool in the right colon unchanged. No significant stool in the left colon or rectum. Nonobstructive bowel gas pattern. Deformity of the right subcapital femoral neck suggestive of acute or chronic fracture similar to the prior study. Correlate with symptoms. IMPRESSION: No change in stool in the right colon. Nonobstructive bowel gas pattern. Right femoral neck deformity suggestive of acute or chronic fracture. Electronically Signed   By: Marlan Palau M.D.   On: 05/19/2016 06:52     Scheduled Meds: . bisacodyl  20 mg Rectal Once  . enoxaparin (LOVENOX) injection  30 mg Subcutaneous Q24H  . escitalopram  10 mg Oral Daily  . piperacillin-tazobactam (ZOSYN)  IV  3.375 g Intravenous Q8H  . polyethylene glycol  17 g Oral BID   Continuous Infusions:    LOS: 1 day    Time spent: 30 min    Renae FickleSHORT, Nikkolas Coomes, MD [facility] Pager [phone]  If 7PM-7AM, please contact night-coverage [email or web address] Password [email or web address] 05/19/2016, 7:17 PM

## 2016-05-19 NOTE — Consult Note (Signed)
ORTHOPAEDIC CONSULTATION  REQUESTING PHYSICIAN: Renae FickleMackenzie Short, MD  PCP:  Ron ParkerBOWEN,SAMUEL, MD  Chief Complaint: Right femoral neck fracture  HPI: Paige Flores is a 75 y.o. female with severe dementia and aphasia who is a nursing home resident. She was found down at her nursing home and sent to the hospital. X-rays in the emergency department revealed a displaced right femoral neck fracture. The patient is a nonambulator; she is wheelchair and bedbound. She has lost 35 pounds since September. The history was obtained from the medical record due to the patient's mental status.   Past Medical History:  Diagnosis Date  . Aphasia   . Bradycardia   . Dementia    vascular  . Depression   . Diabetes mellitus without complication (HCC)   . Hypertension   . Subdural bleeding [facility](HCC)    Past Surgical History:  Procedure Laterality Date  . PACEMAKER INSERTION  2000   Social History   Social History  . Marital status: Widowed    Spouse name: N/A  . Number of children: 3  . Years of education: 12   Occupational History  .      na   Social History Main Topics  . Smoking status: Former Games developermoker  . Smokeless tobacco: Never Used  . Alcohol use No  . Drug use: No  . Sexual activity: Not Asked   Other Topics Concern  . None   Social History Narrative   Living at [facility] since April  2017   No caffeine   Family History  Problem Relation Age of Onset  . Dementia Mother   . Healthy Father    No Known Allergies Prior to Admission medications   Medication Sig Start Date End Date Taking? Authorizing Provider  acetaminophen (TYLENOL) 500 MG tablet Take 500 mg by mouth every 4 (four) hours as needed for mild pain.   Yes Historical Provider, MD  alum & mag hydroxide-simeth (MAALOX PLUS) 400-400-40 MG/5ML suspension Take 15 mLs by mouth every 6 (six) hours as needed for indigestion.   Yes Historical Provider, MD  cholecalciferol (VITAMIN D) 1000 units tablet Take 2,000 Units  by mouth daily.   Yes Historical Provider, MD  diltiazem (DILACOR XR) 180 MG 24 hr capsule Take 180 mg by mouth daily.   Yes Historical Provider, MD  escitalopram (LEXAPRO) 10 MG tablet Take 10 mg by mouth daily.   Yes Historical Provider, MD  fluconazole (DIFLUCAN) 100 MG tablet Take 100 mg by mouth daily. x5 days 05/15/16  Yes Historical Provider, MD  guaifenesin (ROBITUSSIN) 100 MG/5ML syrup Take 200 mg by mouth every 6 (six) hours as needed for cough.   Yes Historical Provider, MD  loperamide (IMODIUM A-D) 2 MG tablet Take 2 mg by mouth 3 (three) times daily as needed for diarrhea or loose stools.   Yes Historical Provider, MD  magnesium hydroxide (MILK OF MAGNESIA) 400 MG/5ML suspension Take 30 mLs by mouth at bedtime as needed for mild constipation.   Yes Historical Provider, MD  magnesium oxide (MAG-OX) 400 MG tablet Take 400 mg by mouth daily.   Yes Historical Provider, MD  Melatonin 5 MG TABS Take 10 mg by mouth daily.    Yes Historical Provider, MD  neomycin-bacitracin-polymyxin (NEOSPORIN) ointment Apply 1 application topically as needed for wound care. apply to eye   Yes Historical Provider, MD  Nutritional Supplements (NUTRITIONAL DRINK PO) Take 1 each by mouth 4 (four) times daily. Mighty shakes   Yes Historical Provider, MD  Hulda MarinFLUZONE  HIGH-DOSE 0.5 ML SUSY  03/14/16   Historical Provider, MD   Ct Head Wo Contrast  Result Date: 05/17/2016 CLINICAL DATA:  Vascular dementia. Unwitnessed fall. Bruised along the forehead. EXAM: CT HEAD WITHOUT CONTRAST CT CERVICAL SPINE WITHOUT CONTRAST TECHNIQUE: Multidetector CT imaging of the head and cervical spine was performed following the standard protocol without intravenous contrast. Multiplanar CT image reconstructions of the cervical spine were also generated. COMPARISON:  04/25/2016 FINDINGS: Despite efforts by the technologist and patient, motion artifact is present on today's exam and could not be eliminated. This reduces exam sensitivity and  specificity. CT HEAD FINDINGS Brain: The brainstem, cerebellum, cerebral peduncles, thalami, basal ganglia, basilar cisterns, and ventricular system appear within normal limits. Periventricular white matter and corona radiata hypodensities favor chronic ischemic microvascular white matter disease. No intracranial hemorrhage, mass lesion, or acute CVA. Cerebral atrophy. Vascular: Unremarkable Skull: Unremarkable Sinuses/Orbits: Unremarkable Other: No supplemental non-categorized findings. CT CERVICAL SPINE FINDINGS Alignment: 2.5 mm degenerative anterolisthesis at C4-5, no change from 04/25/2016. Skull base and vertebrae: No fracture or acute subluxation is identified. Extensive multilevel degenerative facet arthropathy. Loss of disc height at all levels between C5 and T2. Soft tissues and spinal canal: Unremarkable Disc levels: Osseous foraminal stenosis on the left that C3-4, C4-5, and C5-6 due to facet and uncinate spurring. Upper chest: Aortic arch atherosclerotic calcification. Other: No supplemental non-categorized findings. IMPRESSION: 1. No acute intracranial findings or acute cervical spine findings. 2. Periventricular white matter and corona radiata hypodensities favor chronic ischemic microvascular white matter disease. 3. Considerable cervical spondylosis and degenerative disc disease. 4. Aortic arch atherosclerosis. Electronically Signed   By: Gaylyn Rong M.D.   On: 05/17/2016 21:47   Ct Cervical Spine Wo Contrast  Result Date: 05/17/2016 CLINICAL DATA:  Vascular dementia. Unwitnessed fall. Bruised along the forehead. EXAM: CT HEAD WITHOUT CONTRAST CT CERVICAL SPINE WITHOUT CONTRAST TECHNIQUE: Multidetector CT imaging of the head and cervical spine was performed following the standard protocol without intravenous contrast. Multiplanar CT image reconstructions of the cervical spine were also generated. COMPARISON:  04/25/2016 FINDINGS: Despite efforts by the technologist and patient, motion  artifact is present on today's exam and could not be eliminated. This reduces exam sensitivity and specificity. CT HEAD FINDINGS Brain: The brainstem, cerebellum, cerebral peduncles, thalami, basal ganglia, basilar cisterns, and ventricular system appear within normal limits. Periventricular white matter and corona radiata hypodensities favor chronic ischemic microvascular white matter disease. No intracranial hemorrhage, mass lesion, or acute CVA. Cerebral atrophy. Vascular: Unremarkable Skull: Unremarkable Sinuses/Orbits: Unremarkable Other: No supplemental non-categorized findings. CT CERVICAL SPINE FINDINGS Alignment: 2.5 mm degenerative anterolisthesis at C4-5, no change from 04/25/2016. Skull base and vertebrae: No fracture or acute subluxation is identified. Extensive multilevel degenerative facet arthropathy. Loss of disc height at all levels between C5 and T2. Soft tissues and spinal canal: Unremarkable Disc levels: Osseous foraminal stenosis on the left that C3-4, C4-5, and C5-6 due to facet and uncinate spurring. Upper chest: Aortic arch atherosclerotic calcification. Other: No supplemental non-categorized findings. IMPRESSION: 1. No acute intracranial findings or acute cervical spine findings. 2. Periventricular white matter and corona radiata hypodensities favor chronic ischemic microvascular white matter disease. 3. Considerable cervical spondylosis and degenerative disc disease. 4. Aortic arch atherosclerosis. Electronically Signed   By: Gaylyn Rong M.D.   On: 05/17/2016 21:47   Dg Pelvis Portable  Result Date: 05/18/2016 CLINICAL DATA:  Unwitnessed fall.  Dementia. EXAM: PORTABLE PELVIS 1-2 VIEWS COMPARISON:  None. FINDINGS: Single view of the pelvis. Large amount of ascending  colonic and cecal stool. Femoral heads are located. Sacroiliac joints are grossly symmetric. Apparent deformity of the right femoral head/ neck junction. IMPRESSION: Apparent deformity of the right femoral head/neck  junction. Suspicious for fracture. correlate with patient's area of tenderness and consider dedicated right hip radiographs. Electronically Signed   By: Jeronimo GreavesKyle  Talbot M.D.   On: 05/18/2016 08:59   Dg Chest Port 1 View  Result Date: 05/17/2016 CLINICAL DATA:  Altered mental status, unwitnessed fall. Dementia. History of hypertension, diabetes, bradycardia and former smoker. EXAM: PORTABLE CHEST 1 VIEW COMPARISON:  03/16/2016 FINDINGS: The heart is top-normal in size. The aorta is tortuous and atherosclerotic. Right atrial and right ventricular pacing wires are noted with left-sided pacemaker apparatus projecting over the left axilla. Lungs are free of pneumonic consolidations, CHF and effusions. No pneumothoraces. No acute osseous abnormality. The patient's chin obscures the medial lung apices. IMPRESSION: No active disease. Electronically Signed   By: Tollie Ethavid  Kwon M.D.   On: 05/17/2016 21:45   Dg Abd Portable 1v  Result Date: 05/19/2016 CLINICAL DATA:  Constipation EXAM: PORTABLE ABDOMEN - 1 VIEW COMPARISON:  05/18/2016 FINDINGS: Moderate stool in the right colon unchanged. No significant stool in the left colon or rectum. Nonobstructive bowel gas pattern. Deformity of the right subcapital femoral neck suggestive of acute or chronic fracture similar to the prior study. Correlate with symptoms. IMPRESSION: No change in stool in the right colon. Nonobstructive bowel gas pattern. Right femoral neck deformity suggestive of acute or chronic fracture. Electronically Signed   By: Marlan Palauharles  Clark M.D.   On: 05/19/2016 06:52    Positive ROS: All other systems have been reviewed and were otherwise negative with the exception of those mentioned in the HPI and as above.  Physical Exam: General: Alert, no acute distress Cardiovascular: No pedal edema Respiratory: No cyanosis, no use of accessory musculature GI: No organomegaly, abdomen is soft and non-tender Skin: No lesions in the area of chief  complaint Neurologic:  unable to assess secondary to mental status Psychiatric:  patient is noncompliant with physical exam ymphatic: No axillary or cervical lymphadenopathy  MUSCULOSKELETAL:  examination of the right lower extremity reveals that she is shortened and externally rotated. She has pain with logrolling of the hip. Motor and sensory exam unable to be performed secondary to mental status. Her foot is well-perfused.  Assessment: displaced right femoral neck fracture nonambulator with multiple medical problems  Plan: Due to the patient's medical comorbidities, I would recommend nonoperative treatment of her right femoral neck fracture. She would not benefit from operative fixation of her hip fracture due to her limited function. She will be nonweightbearing, bed to chair transfers. DVT prophylaxis per primary team. I will see her in the office 4 weeks after discharge.    Carmin Alvidrez, Cloyde ReamsBrian James, MD Cell [phone]    05/19/2016 7:56 AM

## 2016-05-19 NOTE — Clinical Social Work Note (Signed)
Clinical Social Work Assessment  Patient Details  Name: Paige Flores MRN: [id number] Date of Birth: 08/22/1940  Date of referral:  05/19/16               Reason for consult:  Facility Placement                Permission sought to share information with:  Facility Sport and exercise psychologist, Family Supports Permission granted to share information::     Name::        Agency::     Relationship::  Daughter  Contact Information:     Housing/Transportation Living arrangements for the past 2 months:  [place] of Information:  Adult Children Patient Interpreter Needed:  None Criminal Activity/Legal Involvement Pertinent to Current Situation/Hospitalization:  No - Comment as needed Significant Relationships:  Adult Children Lives with:  Facility Resident Do you feel safe going back to the place where you live?  No Need for family participation in patient care:  Yes (Comment)  Care giving concerns:  Expressed concerns with patient care at [place]. She reports she is not sure where to send her mother, but the place will need to a LTC facility because she cannot care for patient at home.    Social Worker assessment / plan:  LCSWA met with daughter at bedside and explained reason for consult-assist with placement. Pt. Daughter express she was inform the pt. will need residential hospice since she was not eating or drinking on arrival into hospital, but the patient has been eating. The daughter wants LTC placement.  Plan:Assist with placement.   Employment status:  Retired Nurse, adult PT Recommendations:  Not assessed at this time Information / Referral to community resources:  [place]  Patient/Family's Response to care:  Pt. Daughter is trying to understand pt. Care and response to care.   Patient/Family's Understanding of and Emotional Response to Diagnosis, Current Treatment, and Prognosis: Pt. Daughter  reports she is trying to "what's best for the patient."  Emotional Assessment Appearance:  Appears stated age Attitude/Demeanor/Rapport:    Affect (typically observed):    Orientation:  Oriented to Self Alcohol / Substance use:  Not Applicable Psych involvement (Current and /or in the community):  No (Comment)  Discharge Needs  Concerns to be addressed:  Care Coordination, Discharge Planning Concerns Readmission within the last 30 days:  No Current discharge risk:    Barriers to Discharge:  Continued Medical Work up   [employer], LCSW 05/19/2016, 4:44 PM

## 2016-05-20 DIAGNOSIS — I471 Supraventricular tachycardia: Secondary | ICD-10-CM

## 2016-05-20 DIAGNOSIS — Z950 Presence of cardiac pacemaker: Secondary | ICD-10-CM

## 2016-05-20 DIAGNOSIS — R6510 Systemic inflammatory response syndrome (SIRS) of non-infectious origin without acute organ dysfunction: Secondary | ICD-10-CM

## 2016-05-20 DIAGNOSIS — S72001A Fracture of unspecified part of neck of right femur, initial encounter for closed fracture: Secondary | ICD-10-CM

## 2016-05-20 LAB — BASIC METABOLIC PANEL
Anion gap: 6 (ref 5–15)
Anion gap: 10 (ref 5–15)
BUN: 8 mg/dL (ref 6–20)
BUN: 8 mg/dL (ref 6–20)
CHLORIDE: 108 mmol/L (ref 101–111)
CHLORIDE: 108 mmol/L (ref 101–111)
CO2: 25 mmol/L (ref 22–32)
CO2: 25 mmol/L (ref 22–32)
CREATININE: 0.66 mg/dL (ref 0.44–1.00)
CREATININE: 0.69 mg/dL (ref 0.44–1.00)
Calcium: 8 mg/dL — ABNORMAL LOW (ref 8.9–10.3)
Calcium: 8.5 mg/dL — ABNORMAL LOW (ref 8.9–10.3)
GFR calc Af Amer: >60 mL/min (ref >60)
GFR calc Af Amer: >60 mL/min (ref >60)
GFR calc non Af Amer: >60 mL/min (ref >60)
Glucose, Bld: 99 mg/dL (ref 65–99)
Glucose, Bld: 149 mg/dL — ABNORMAL HIGH (ref 65–99)
POTASSIUM: 3.1 mmol/L — AB (ref 3.5–5.1)
Potassium: 3.8 mmol/L (ref 3.5–5.1)
Sodium: 139 mmol/L (ref 135–145)
Sodium: 143 mmol/L (ref 135–145)

## 2016-05-20 LAB — CBC
HEMATOCRIT: 36.8 % (ref 36.0–46.0)
Hemoglobin: 12.3 g/dL (ref 12.0–15.0)
MCH: 29.9 pg (ref 26.0–34.0)
MCHC: 33.4 g/dL (ref 30.0–36.0)
MCV: 89.5 fL (ref 78.0–100.0)
Platelets: 311 10*3/uL (ref 150–400)
RBC: 4.11 MIL/uL (ref 3.87–5.11)
RDW: 14 % (ref 11.5–15.5)
WBC: 11.6 10*3/uL — ABNORMAL HIGH (ref 4.0–10.5)

## 2016-05-20 LAB — MAGNESIUM: Magnesium: 1.8 mg/dL (ref 1.7–2.4)

## 2016-05-20 MED ORDER — SODIUM CHLORIDE 0.9 % IV SOLN: 30.0000 meq | Freq: Once | INTRAVENOUS | Status: DC

## 2016-05-20 MED ORDER — POTASSIUM CHLORIDE CRYS ER 20 MEQ PO TBCR
40.0000 meq | EXTENDED_RELEASE_TABLET | Freq: Once | ORAL | Status: AC
  Administered 2016-05-20: 40 meq via ORAL
  Filled 2016-05-20: qty 2

## 2016-05-20 MED ORDER — POTASSIUM CHLORIDE 10 MEQ/100ML IV SOLN
10.0000 meq | INTRAVENOUS | Status: AC
Start: 2016-05-20 — End: 2016-05-20
  Administered 2016-05-20 (×3): 10 meq via INTRAVENOUS
  Filled 2016-05-20 (×3): qty 100

## 2016-05-20 MED ORDER — ENSURE ENLIVE PO LIQD
237.0000 mL | Freq: Two times a day (BID) | ORAL | Status: DC
  Administered 2016-05-20 – 2016-05-22 (×2): 237 mL via ORAL

## 2016-05-20 MED ORDER — ADULT MULTIVITAMIN LIQUID CH
15.0000 mL | Freq: Every day | ORAL | Status: DC
  Administered 2016-05-20 – 2016-05-22 (×3): 15 mL via ORAL
  Filled 2016-05-20 (×3): qty 15

## 2016-05-20 NOTE — NC FL2 (Signed)
[place] MEDICAID FL2 LEVEL OF CARE SCREENING TOOL     IDENTIFICATION  Patient Name: Paige AreolaDaisy Forrer Birthdate: September 03, 1940 Sex: female Admission Date (Current Location): 05/17/2016  [facility] [place]   [facility] Address[facility]  [facility],  [address], [place] [id number]      Provider Number: [id number]  Attending Physician Name and Address:  Meredeth IdeGagan S Lama, MD  Relative Name and Phone Number:       Current Level of Care: Hospital Recommended Level of Care: Skilled Nursing Facility Prior Approval Number:    Date Approved/Denied:   PASRR Number:   [id number]   Discharge Plan: SNF    Current Diagnoses: Patient Active Problem List   Diagnosis Date Noted  . Fracture of femoral neck, right (HCC) 05/18/2016  . Hypokalemia   . Contusion of face   . Fall   . Fever 05/17/2016  . Protein-calorie malnutrition, severe (HCC) 03/18/2016  . SIRS (systemic inflammatory response syndrome) (HCC) 03/17/2016  . HCAP (healthcare-associated pneumonia) 03/17/2016  . Lactic acidosis 03/17/2016  . Acute respiratory failure with hypoxia (HCC) 03/17/2016  . Elevated troponin 03/17/2016  . Seizure-like activity (HCC) 03/17/2016  . Acute encephalopathy 03/17/2016  . Paroxysmal atrial tachycardia (HCC) 01/03/2016  . SSS (sick sinus syndrome) (HCC) 01/03/2016  . Pacemaker 01/03/2016  . Murmur 01/03/2016  . Hyperlipidemia 01/03/2016    Orientation RESPIRATION BLADDER Height & Weight     Self  Normal Indwelling catheter Weight: 95 lb 14.4 oz (43.5 kg) Height:     BEHAVIORAL SYMPTOMS/MOOD NEUROLOGICAL BOWEL NUTRITION STATUS      Incontinent Diet  AMBULATORY STATUS COMMUNICATION OF NEEDS Skin   Total Care Does not communicate Normal                       Personal Care Assistance Level of Assistance  Bathing, Feeding, Dressing, Total care Bathing Assistance: Maximum assistance Feeding assistance: Maximum assistance Dressing Assistance: Maximum  assistance Total Care Assistance: Maximum assistance   Functional Limitations Info  Sight, Hearing, Speech Sight Info: Adequate Hearing Info: Adequate Speech Info: Adequate    SPECIAL CARE FACTORS FREQUENCY                       Contractures Contractures Info: Not present    Additional Factors Info  Code Status, Allergies Code Status Info: DNR Allergies Info:  No Known Allergies           Current Medications (05/20/2016):  This is the current hospital active medication list Current Facility-Administered Medications  Medication Dose Route Frequency Provider Last Rate Last Dose  . acetaminophen (TYLENOL) tablet 650 mg  650 mg Oral Q6H PRN Eduard ClosArshad N Kakrakandy, MD       Or  . acetaminophen (TYLENOL) suppository 650 mg  650 mg Rectal Q6H PRN Eduard ClosArshad N Kakrakandy, MD      . bisacodyl (DULCOLAX) suppository 20 mg  20 mg Rectal Once Renae FickleMackenzie Short, MD      . enoxaparin (LOVENOX) injection 30 mg  30 mg Subcutaneous Q24H Otho Bellowserri L Green, RPH   30 mg at 05/19/16 2219  . escitalopram (LEXAPRO) tablet 10 mg  10 mg Oral Daily Eduard ClosArshad N Kakrakandy, MD   10 mg at 05/20/16 0902  . feeding supplement (ENSURE ENLIVE) (ENSURE ENLIVE) liquid 237 mL  237 mL Oral BID WC Meredeth IdeGagan S Lama, MD      . morphine CONCENTRATE 10 MG/0.5ML oral solution 5 mg  5 mg Oral  Q2H PRN Renae FickleMackenzie Short, MD      . multivitamin liquid 15 mL  15 mL Oral Daily Meredeth IdeGagan S Lama, MD      . ondansetron [facility](ZOFRAN) tablet 4 mg  4 mg Oral Q6H PRN Eduard ClosArshad N Kakrakandy, MD       Or  . ondansetron [facility] [place] [facility](ZOFRAN) injection 4 mg  4 mg Intravenous Q6H PRN Eduard ClosArshad N Kakrakandy, MD      . piperacillin-tazobactam (ZOSYN) IVPB 3.375 g  3.375 g Intravenous Q8H Eduard ClosArshad N Kakrakandy, MD   3.375 g at 05/20/16 0521  . potassium chloride 10 mEq in 100 mL IVPB  10 mEq Intravenous Q1 Hr x 3 Meredeth IdeGagan S Lama, MD   10 mEq at 05/20/16 1235     Discharge Medications: Please see discharge summary for a list of discharge medications.  Relevant Imaging  Results:  Relevant Lab Results:   Additional Information [id number]  Clearance CootsNicole A [place] Voytko, LCSW

## 2016-05-20 NOTE — Progress Notes (Signed)
[facility]  PROGRESS NOTE  Edwinna AreolaDaisy Castellanos EXB:[id number] DOB: [birth year] DOA: 05/17/2016 PCP: Ron ParkerBOWEN,SAMUEL, MD   Brief HPI:   75 y.o.femalewith advanced dementia, sick sinus syndrome status post pacemaker placement, paroxysmal atrial tachycardia was brought to the ER, afterpatient had an unwitnessed fall at her nursing facility. Patient is noncommunicative at baseline and bed and wheelchair bound.  She has lost 35-lbs since September of this year per our records and is frail appearing.  She was found to be febrile and tachycardic in the ER.  CXR and UA were unremarkable.  Flu PCR negative.  Cultures are pending and she is on empiric antibiotics.  Pelvic XR demonstrates right femoral neck fracture.  Due to patient's frailty, family and orthopedic surgery agreed to no surgery.  Awaiting culture data, but if negative, could likely stop antibiotics and discharge back to SNF on Wednesday.      Subjective   Patent seen and examined, no new complaints.   Assessment/Plan:     1. Unwitnessed fall with right hip fracture- orthopedic surgery consulted, both orthopedics as well as family opted for nonoperative approach. 2. 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 Patient on dysphagia 1 diet with thin liquids as per speech therapy evaluation and recommendations. 3. SIRS-patient had fever 101.1, with tachycardia and tachypnea. Unclear technology. Blood and urine cultures are negative to date. Fever has now resolved. 4. Constipation- seen on pelvic x-ray, patient started on MiraLAX twice a day. Has been having 3-4 bowel movements everyday. Will hold MiraLAX at this time. 5. Paroxysmal atrial fibrillation/sick sinus syndrome- status post pacemaker insertion. 6. Moderate aortic valve stenosis- shows evidence of LVH and diastolic dysfunction but preserved EF on echocardiogram    DVT prophylaxis: Lovenox  Code Status: DNR  Family Communication: Discussed with patients  daughter at bedside   Disposition Plan: SNF in next 24 hrs   Consultants:  Orthopedics  Procedures:  None   Continuous infusions     Antibiotics:   Anti-infectives    Start     Dose/Rate Route Frequency Ordered Stop   05/18/16 1200  vancomycin (VANCOCIN) 500 mg in sodium chloride 0.9 % 100 mL IVPB  Status:  Discontinued     500 mg 100 mL/hr over 60 Minutes Intravenous Every 12 hours 05/18/16 0318 05/18/16 1528   05/18/16 0600  piperacillin-tazobactam (ZOSYN) IVPB 3.375 g     3.375 g 12.5 mL/hr over 240 Minutes Intravenous Every 8 hours 05/18/16 0318     05/17/16 2130  piperacillin-tazobactam (ZOSYN) IVPB 3.375 g     3.375 g 100 mL/hr over 30 Minutes Intravenous  Once 05/17/16 2115 05/17/16 2232   05/17/16 2130  vancomycin (VANCOCIN) IVPB 1000 mg/200 mL premix     1,000 mg 200 mL/hr over 60 Minutes Intravenous  Once 05/17/16 2115 05/17/16 2302       Objective   Vitals:   05/19/16 1538 05/19/16 2031 05/20/16 0458 05/20/16 1526  BP: 111/82 137/81 121/68 132/86  Pulse: 77 80 60 100  Resp: 18 16 16 18   Temp: 97.9 F (36.6 C) 98.6 F (37 C) 97.5 F (36.4 C) 98.7 F (37.1 C)  TempSrc: Oral Axillary Axillary Axillary  SpO2: 97% 98% 96% 98%  Weight:        Intake/Output Summary (Last 24 hours) at 05/20/16 1722 Last data filed at 05/20/16 1240  Gross per 24 hour  Intake              360 ml  Output  0 ml  Net              360 ml   Filed Weights   05/17/16 2112 05/18/16 0658  Weight: 50.7 kg (111 lb 12.4 oz) 43.5 kg (95 lb 14.4 oz)     Physical Examination:  General exam: Appears calm and comfortable. Respiratory system: Clear to auscultation. Respiratory effort normal. Cardiovascular system:  RRR. No  murmurs, rubs, gallops. No pedal edema. GI system: Abdomen is nondistended, soft and nontender. No organomegaly.  Central nervous system. No focal neurological deficits. 5 x 5 power in all extremities.Alert, not oriented x3 Skin: No rashes,  lesions or ulcers.     Data Reviewed: I have personally reviewed following labs and imaging studies  CBG: No results for input(s): GLUCAP in the last 168 hours.  CBC:  Recent Labs Lab 05/17/16 2148 05/18/16 0609 05/19/16 0537 05/20/16 0551  WBC 13.8* 11.8* 12.9* 11.6*  NEUTROABS 9.8*  --   --   --   HGB 14.2 12.6 11.5* 12.3  HCT 43.5 38.3 35.0* 36.8  MCV 90.2 89.9 90.2 89.5  PLT 366 320 312 311    Basic Metabolic Panel:  Recent Labs Lab 05/17/16 2148 05/18/16 0609 05/19/16 0537 05/20/16 0551 05/20/16 1024 05/20/16 1440  NA 142 141 140 139  --  143  K 3.1* 3.8 3.1* 3.1*  --  3.8  CL 105 109 107 108  --  108  CO2 28 25 23 25   --  25  GLUCOSE 180* 131* 116* 99  --  149*  BUN 14 9 9 8   --  8  CREATININE 0.72 0.63 0.68 0.66  --  0.69  CALCIUM 9.1 8.2* 8.2* 8.0*  --  8.5*  MG  --   --   --   --  1.8  --     Recent Results (from the past 240 hour(s))  Blood Culture (routine x 2)     Status: None (Preliminary result)   Collection Time: 05/17/16  9:18 PM  Result Value Ref Range Status   Specimen Description BLOOD RIGHT WRIST  Final   Special Requests BOTTLES DRAWN AEROBIC AND ANAEROBIC 5 CC  Final   Culture   Final    NO GROWTH 2 DAYS Performed at [facility]    Report Status PENDING  Incomplete  Urine culture     Status: None   Collection Time: 05/17/16  9:32 PM  Result Value Ref Range Status   Specimen Description URINE, CATHETERIZED  Final   Special Requests NONE  Final   Culture NO GROWTH Performed at [facility]   Final   Report Status 05/19/2016 FINAL  Final  Blood Culture (routine x 2)     Status: None (Preliminary result)   Collection Time: 05/17/16  9:48 PM  Result Value Ref Range Status   Specimen Description BLOOD RIGHT ANTECUBITAL  Final   Special Requests BOTTLES DRAWN AEROBIC AND ANAEROBIC 6 CC  Final   Culture   Final    NO GROWTH 2 DAYS Performed at [facility]     Report Status PENDING  Incomplete  MRSA PCR  Screening     Status: None   Collection Time: 05/18/16  2:20 AM  Result Value Ref Range Status   MRSA by PCR NEGATIVE NEGATIVE Final    Comment:        The GeneXpert MRSA Assay (FDA approved for NASAL specimens only), is one component of a comprehensive MRSA colonization surveillance program. It  is not intended to diagnose MRSA infection nor to guide or monitor treatment for MRSA infections.      Liver Function Tests:  Recent Labs Lab 05/17/16 2148  AST 17  ALT 15  ALKPHOS 113  BILITOT 0.6  PROT 7.6  ALBUMIN 3.4*   No results for input(s): LIPASE, AMYLASE in the last 168 hours. No results for input(s): AMMONIA in the last 168 hours.  Cardiac Enzymes: No results for input(s): CKTOTAL, CKMB, CKMBINDEX, TROPONINI in the last 168 hours. BNP (last 3 results) No results for input(s): BNP in the last 8760 hours.  ProBNP (last 3 results) No results for input(s): PROBNP in the last 8760 hours.    Studies: Dg Abd Portable 1v  Result Date: 05/19/2016 CLINICAL DATA:  Constipation EXAM: PORTABLE ABDOMEN - 1 VIEW COMPARISON:  05/18/2016 FINDINGS: Moderate stool in the right colon unchanged. No significant stool in the left colon or rectum. Nonobstructive bowel gas pattern. Deformity of the right subcapital femoral neck suggestive of acute or chronic fracture similar to the prior study. Correlate with symptoms. IMPRESSION: No change in stool in the right colon. Nonobstructive bowel gas pattern. Right femoral neck deformity suggestive of acute or chronic fracture. Electronically Signed   By: Marlan Palau M.D.   On: 05/19/2016 06:52    Scheduled Meds: . bisacodyl  20 mg Rectal Once  . enoxaparin (LOVENOX) injection  30 mg Subcutaneous Q24H  . escitalopram  10 mg Oral Daily  . feeding supplement (ENSURE ENLIVE)  237 mL Oral BID WC  . multivitamin  15 mL Oral Daily  . piperacillin-tazobactam (ZOSYN)  IV  3.375 g Intravenous Q8H      Time spent: 25 min  [facility]  S   [facility] Pager [phone]. If 7PM-7AM, please contact night-coverage at [email or web address], Office  [phone]  password [email or web address] 05/20/2016, 5:22 PM  LOS: 2 days

## 2016-05-20 NOTE — Progress Notes (Signed)
Initial Nutrition Assessment  DOCUMENTATION CODES:   Severe malnutrition in context of chronic illness  INTERVENTION:  Provide Ensure Enlive po BID, each supplement provides 350 kcal and 20 grams of protein.  Provide liquid multivitamin with minerals daily.   NUTRITION DIAGNOSIS:   Malnutrition (Severe) related to chronic illness as evidenced by 27 percent weight loss over 4 months, severe depletion of muscle mass.  GOAL:   Patient will meet greater than or equal to 90% of their needs  MONITOR:   PO intake, Supplement acceptance, Labs, Weight trends, I & O's  REASON FOR ASSESSMENT:   Low Braden    ASSESSMENT:   75 y.o.femalewith advanced dementia, sick sinus syndrome status post pacemaker placement, paroxysmal atrial tachycardia was brought to the ER, afterpatient had an unwitnessed fall at her nursing facility. Patient is noncommunicative at baseline and bed and wheelchair bound.  She has lost 35-lbs since September of this year per our records and is frail appearing.  She was found to be febrile and tachycardic in the ER. Pelvic XR demonstrates right femoral neck fracture.  Due to patient's frailty, family and orthopedic surgery agreed to no surgery.   -Per SLP evaluation patient with mild aspiration risk. Recommended Dysphagia 1 with thin liquids.  Attempted to speak with patient at bedside but she is non-communicative and no family members were present. Patient grinding teeth at time of assessment. Discussed with RN who reports patient was not eating well before placed on Dysphagia 1 diet but eats well now. Patient requires full assist with feeding.   Per chart patient was 130 lbs in early 01/2016 and has since lost 35 lbs (27% body weight) over almost 4 months, which is significant for time frame.   Meal Completion: 50-80% per chart  Medications reviewed and include: Dulcolax, Zosyn.  Labs reviewed: Potassium 3.1.  Nutrition-Focused physical exam completed.  Findings are moderate fat depletion, moderate-severe muscle depletion, and no edema.   Diet Order:  DIET - DYS 1 Room service appropriate? Yes; Fluid consistency: Thin  Skin:  Reviewed, no issues  Last BM:  05/20/2016  Height:   Ht Readings from Last 1 Encounters:  03/17/16 4\' 10"  (1.473 m)    Weight:   Wt Readings from Last 1 Encounters:  05/18/16 95 lb 14.4 oz (43.5 kg)    Ideal Body Weight:  40.9 kg  BMI:  Body mass index is 20.04 kg/m.  Estimated Nutritional Needs:   Kcal:  1150-1345 (HBE x 1.2-1.4)  Protein:  52-62 grams (1.2-1.4 grams/kg)  Fluid:  >/= 1.2 L/day (25 ml/kg)  EDUCATION NEEDS:   Education needs no appropriate at this time  Helane RimaLeanne Le Ferraz, MS, RD, LDN Pager: [phone] After Hours Pager: [phone]

## 2016-05-20 NOTE — Progress Notes (Signed)
Speech Language Pathology Treatment: Dysphagia  Patient Details Name: Paige AreolaDaisy Pecha MRN: [id number] DOB: 1941/01/19 Today's Date: 05/20/2016 Time: [id number] SLP Time Calculation (min) (ACUTE ONLY): 22 min  Assessment / Plan / Recommendation Clinical Impression  Pt seen for skilled observation and potential diet upgrade; pt's family present and provided information re: prior level of functioning re: po intake; pt exhibited oral holding without minimal verbal cueing and encouragement to swallow various consistencies; prolonged mastication noted with soft solids, but pt's intake was improved from puree consistency; no s/s of aspiration noted with soft solids and/or thin liquids via straw with minimal verbal cueing provided during session; daughter is concerned with limited intake overall and strategies/education provided to increase intake such as providing sensory stimulation, alternating desired foods (i.e.: sweets, carbohydrates) with higher nutrition foods and allowing pt to participate in self-feeding with assistance.  ST will continue to f/u while pt is in house and upgrade diet further if indicated.  HPI HPI: 75 year old female admitted 05/17/16 with right femoral neck fracture. PMH significant for severe dementia, aphasia. Pt is nonverbal at baseline. BSE ordered to evaluate swallow function and safety.      SLP Plan  Continue with current plan of care     Recommendations  Diet recommendations: Dysphagia 2 (fine chop);Thin liquid Liquids provided via: Cup;Straw Medication Administration: Crushed with puree Supervision: Staff to assist with self feeding;Full supervision/cueing for compensatory strategies Compensations: Minimize environmental distractions;Slow rate;Small sips/bites;Follow solids with liquid                Oral Care Recommendations: Oral care QID;Staff/trained caregiver to provide oral care Follow up Recommendations: 24 hour supervision/assistance Plan: Continue  with current plan of care                      ADAMS,PAT, M.S., CCC-SLP 05/20/2016, 2:54 PM

## 2016-05-20 NOTE — Progress Notes (Signed)
Plan for d/c to SNF, discharge planning per CSW. [phone] 

## 2016-05-20 NOTE — Progress Notes (Signed)
OT Cancellation Note  Patient Details Name: Paige AreolaDaisy Flores MRN: [id number] DOB: 10/13/1940   Cancelled Treatment:    Reason Eval/Treat Not Completed: Other (comment).  Will defer OT eval to SNF.  Pt from SNF and w/c bound at baseline.    Daxtin Leiker 05/20/2016, 7:29 AM  Marica OtterMaryellen Shamieka Gullo, OTR/L [phone] 05/20/2016

## 2016-05-20 NOTE — NC FL2 (Deleted)
[place] MEDICAID FL2 LEVEL OF CARE SCREENING TOOL     IDENTIFICATION  Patient Name: Paige Flores Birthdate: 1940/07/19 Sex: female Admission Date (Current Location): 05/17/2016  [facility] [place]   Facility and Address:  [facility],  [address], [place] [id number]      Provider Number: [id number]  Attending Physician Name and Address:  Meredeth IdeGagan S Lama, MD  Relative Name and Phone Number:       Current Level of Care: Hospital Recommended Level of Care: Skilled Nursing Facility Prior Approval Number:    Date Approved/Denied:   PASRR Number:    Discharge Plan: SNF    Current Diagnoses: Patient Active Problem List   Diagnosis Date Noted  . Fracture of femoral neck, right (HCC) 05/18/2016  . Hypokalemia   . Contusion of face   . Fall   . Fever 05/17/2016  . Protein-calorie malnutrition, severe (HCC) 03/18/2016  . SIRS (systemic inflammatory response syndrome) (HCC) 03/17/2016  . HCAP (healthcare-associated pneumonia) 03/17/2016  . Lactic acidosis 03/17/2016  . Acute respiratory failure with hypoxia (HCC) 03/17/2016  . Elevated troponin 03/17/2016  . Seizure-like activity (HCC) 03/17/2016  . Acute encephalopathy 03/17/2016  . Paroxysmal atrial tachycardia (HCC) 01/03/2016  . SSS (sick sinus syndrome) (HCC) 01/03/2016  . Pacemaker 01/03/2016  . Murmur 01/03/2016  . Hyperlipidemia 01/03/2016    Orientation RESPIRATION BLADDER Height & Weight     Self  Normal Indwelling catheter Weight: 95 lb 14.4 oz (43.5 kg) Height:     BEHAVIORAL SYMPTOMS/MOOD NEUROLOGICAL BOWEL NUTRITION STATUS      Incontinent Diet  AMBULATORY STATUS COMMUNICATION OF NEEDS Skin   Total Care Does not communicate Normal                       Personal Care Assistance Level of Assistance  Bathing, Feeding, Dressing, Total care Bathing Assistance: Maximum assistance Feeding assistance: Maximum assistance Dressing Assistance: Maximum  assistance Total Care Assistance: Maximum assistance   Functional Limitations Info  Sight, Hearing, Speech Sight Info: Adequate Hearing Info: Adequate Speech Info: Adequate    SPECIAL CARE FACTORS FREQUENCY                       Contractures Contractures Info: Not present    Additional Factors Info  Code Status, Allergies Code Status Info: DNR Allergies Info:  No Known Allergies           Current Medications (05/20/2016):  This is the current hospital active medication list Current Facility-Administered Medications  Medication Dose Route Frequency Provider Last Rate Last Dose  . acetaminophen (TYLENOL) tablet 650 mg  650 mg Oral Q6H PRN Eduard ClosArshad N Kakrakandy, MD       Or  . acetaminophen (TYLENOL) suppository 650 mg  650 mg Rectal Q6H PRN Eduard ClosArshad N Kakrakandy, MD      . bisacodyl (DULCOLAX) suppository 20 mg  20 mg Rectal Once Renae FickleMackenzie Short, MD      . enoxaparin (LOVENOX) injection 30 mg  30 mg Subcutaneous Q24H Otho Bellowserri L Green, RPH   30 mg at 05/19/16 2219  . escitalopram (LEXAPRO) tablet 10 mg  10 mg Oral Daily Eduard ClosArshad N Kakrakandy, MD   10 mg at 05/20/16 0902  . morphine CONCENTRATE 10 MG/0.5ML oral solution 5 mg  5 mg Oral Q2H PRN Renae FickleMackenzie Short, MD      . ondansetron [facility](ZOFRAN) tablet 4 mg  4 mg Oral Q6H PRN Eduard ClosArshad N Kakrakandy, MD  Or  . ondansetron (ZOFRAN) injection 4 mg  4 mg Intravenous Q6H PRN Eduard ClosArshad N Kakrakandy, MD      . piperacillin-tazobactam (ZOSYN) IVPB 3.375 g  3.375 g Intravenous Q8H Eduard ClosArshad N Kakrakandy, MD   3.375 g at 05/20/16 0521  . potassium chloride 10 mEq in 100 mL IVPB  10 mEq Intravenous Q1 Hr x 3 Meredeth IdeGagan S Lama, MD   10 mEq at 05/20/16 1235     Discharge Medications: Please see discharge summary for a list of discharge medications.  Relevant Imaging Results:  Relevant Lab Results:   Additional Information [id number]  Clearance CootsNicole A Kimberlin Scheel, LCSW

## 2016-05-20 NOTE — Progress Notes (Signed)
LCSWA met with patient daughter at bedside. Plan is for patient to discharge to SNF. LCSWA contacted requested facility [facility] [address] and discussed need to for care. Facility liaison Abigail Butts reports patent cannot come under [employer] but she cannot come under private pay or other pay options. Patient daughter reports patient has another insurance that she uses to cover cost at ALF. SNF has requested patient daughter fill out paperwork today and their corperate office will review in the morning (no more than an hour process) and determine decision. LCSWA also provided patient daughter with other SNF options.  LCSWA will continue to follow and assist with care.   Kathrin Greathouse, Latanya Presser, MSW Clinical Social Worker 5E and Psychiatric Service Line [phone] 05/20/2016  2:59 PM

## 2016-05-20 NOTE — Progress Notes (Signed)
PT Cancellation Note  Patient Details Name: Paige AreolaDaisy Flores MRN: [id number] DOB: January 15, 1941   Cancelled Treatment:    Reason Eval/Treat Not Completed: PT screened, no needs identified, will sign off. Patient is non surgical hip fracture from SNF, non ambulatory PTA.     Sharen HeckHill, Natayah Warmack Elizabeth Annelisa Ryback PT [phone]  05/20/2016, 7:29 AM

## 2016-05-21 DIAGNOSIS — E876 Hypokalemia: Secondary | ICD-10-CM

## 2016-05-21 DIAGNOSIS — A0472 Enterocolitis due to Clostridium difficile, not specified as recurrent: Secondary | ICD-10-CM

## 2016-05-21 DIAGNOSIS — S72009A Fracture of unspecified part of neck of unspecified femur, initial encounter for closed fracture: Secondary | ICD-10-CM

## 2016-05-21 LAB — BASIC METABOLIC PANEL
Anion gap: 9 (ref 5–15)
BUN: 6 mg/dL (ref 6–20)
CALCIUM: 8.3 mg/dL — AB (ref 8.9–10.3)
CO2: 25 mmol/L (ref 22–32)
CREATININE: 0.63 mg/dL (ref 0.44–1.00)
Chloride: 105 mmol/L (ref 101–111)
Glucose, Bld: 121 mg/dL — ABNORMAL HIGH (ref 65–99)
Potassium: 3.3 mmol/L — ABNORMAL LOW (ref 3.5–5.1)
SODIUM: 139 mmol/L (ref 135–145)

## 2016-05-21 LAB — CBC
HCT: 39.3 % (ref 36.0–46.0)
HEMOGLOBIN: 13 g/dL (ref 12.0–15.0)
MCH: 29.2 pg (ref 26.0–34.0)
MCHC: 33.1 g/dL (ref 30.0–36.0)
MCV: 88.3 fL (ref 78.0–100.0)
PLATELETS: 362 10*3/uL (ref 150–400)
RBC: 4.45 MIL/uL (ref 3.87–5.11)
RDW: 13.6 % (ref 11.5–15.5)
WBC: 14.3 10*3/uL — AB (ref 4.0–10.5)

## 2016-05-21 LAB — C DIFFICILE QUICK SCREEN W PCR REFLEX
C DIFFICILE (CDIFF) INTERP: DETECTED
C DIFFICILE (CDIFF) TOXIN: POSITIVE — AB
C DIFFICLE (CDIFF) ANTIGEN: POSITIVE — AB

## 2016-05-21 MED ORDER — VANCOMYCIN 50 MG/ML ORAL SOLUTION
125.0000 mg | Freq: Four times a day (QID) | ORAL | Status: DC
  Administered 2016-05-21 – 2016-05-22 (×4): 125 mg via ORAL
  Filled 2016-05-21 (×6): qty 2.5

## 2016-05-21 NOTE — Progress Notes (Signed)
CRITICAL VALUE ALERT  Critical value received:  Positive C Diff  Date of notification:  05/21/2016  Time of notification:  1130  Critical value read back:Yes.    Nurse who received alert:  Cathleen FearsBableen K  MD notified (1st page):  [place]  Time of first page:  1130  MD notified (2nd page):  Time of second page:  Responding MD:  Sharl MaLama  Time MD responded:  1130

## 2016-05-21 NOTE — Progress Notes (Signed)
[facility]  PROGRESS NOTE  Paige AreolaDaisy Flores JYN:[id number] DOB: 05/08/1941 DOA: 05/17/2016 PCP: Ron ParkerBOWEN,SAMUEL, MD   Brief HPI:   75 y.o.femalewith advanced dementia, sick sinus syndrome status post pacemaker placement, paroxysmal atrial tachycardia was brought to the ER, afterpatient had an unwitnessed fall at her nursing facility. Patient is noncommunicative at baseline and bed and wheelchair bound.  She has lost 35-lbs since September of this year per our records and is frail appearing.  She was found to be febrile and tachycardic in the ER.  CXR and UA were unremarkable.  Flu PCR negative.  Cultures are pending and she is on empiric antibiotics.  Pelvic XR demonstrates right femoral neck fracture.  Due to patient's frailty, family and orthopedic surgery agreed to no surgery.  Awaiting culture data, but if negative, could likely stop antibiotics and discharge back to SNF on Wednesday.      Subjective   Patent seen and examined,Developed diarrhea yesterday, C. difficile PCR positive for infection   Assessment/Plan:     1. Unwitnessed fall with right hip fracture- orthopedic surgery consulted, both orthopedics as well as family opted for nonoperative approach. 2. Diarrhea /? C. difficile colitis- patient started on vancomycin 125 mg 4 times a day for C. difficile infection. 3. 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 Patient on dysphagia 1 diet with thin liquids as per speech therapy evaluation and recommendations. 4. Paroxysmal atrial fibrillation/sick sinus syndrome- status post pacemaker insertion. 5. Hypokalemia-replace potassium and check BMP in a.m. 6. Moderate aortic valve stenosis- shows evidence of LVH and diastolic dysfunction but preserved EF on echocardiogram    DVT prophylaxis: Lovenox  Code Status: DNR  Family Communication: Discussed with patients daughter at bedside   Disposition Plan: SNF in next 24  hrs   Consultants:  Orthopedics  Procedures:  None   Continuous infusions     Antibiotics:   Anti-infectives    Start     Dose/Rate Route Frequency Ordered Stop   05/21/16 1445  vancomycin (VANCOCIN) 50 mg/mL oral solution 125 mg     125 mg Oral 4 times daily 05/21/16 1442 06/04/16 1359   05/18/16 1200  vancomycin (VANCOCIN) 500 mg in sodium chloride 0.9 % 100 mL IVPB  Status:  Discontinued     500 mg 100 mL/hr over 60 Minutes Intravenous Every 12 hours 05/18/16 0318 05/18/16 1528   05/18/16 0600  piperacillin-tazobactam (ZOSYN) IVPB 3.375 g  Status:  Discontinued     3.375 g 12.5 mL/hr over 240 Minutes Intravenous Every 8 hours 05/18/16 0318 05/21/16 1442   05/17/16 2130  piperacillin-tazobactam (ZOSYN) IVPB 3.375 g     3.375 g 100 mL/hr over 30 Minutes Intravenous  Once 05/17/16 2115 05/17/16 2232   05/17/16 2130  vancomycin (VANCOCIN) IVPB 1000 mg/200 mL premix     1,000 mg 200 mL/hr over 60 Minutes Intravenous  Once 05/17/16 2115 05/17/16 2302       Objective   Vitals:   05/20/16 0458 05/20/16 1526 05/20/16 2149 05/21/16 1508  BP: 121/68 132/86 (!) 161/85 135/85  Pulse: 60 100 85 96  Resp: 16 18 17 18   Temp: 97.5 F (36.4 C) 98.7 F (37.1 C) 98.8 F (37.1 C) 98.2 F (36.8 C)  TempSrc: Axillary Axillary Axillary Oral  SpO2: 96% 98% 100% 99%  Weight:        Intake/Output Summary (Last 24 hours) at 05/21/16 1558 Last data filed at 05/21/16 0827  Gross per 24 hour  Intake  310 ml  Output                0 ml  Net              310 ml   Filed Weights   05/17/16 2112 05/18/16 0658  Weight: 50.7 kg (111 lb 12.4 oz) 43.5 kg (95 lb 14.4 oz)     Physical Examination:  General exam: Appears calm and comfortable. Respiratory system: Clear to auscultation. Respiratory effort normal. Cardiovascular system:  RRR. No  murmurs, rubs, gallops. No pedal edema. GI system: Abdomen is nondistended, soft and nontender. No organomegaly.  Central nervous  system. No focal neurological deficits. 5 x 5 power in all extremities.Alert, not oriented x3 Skin: No rashes, lesions or ulcers.     Data Reviewed: I have personally reviewed following labs and imaging studies  CBG: No results for input(s): GLUCAP in the last 168 hours.  CBC:  Recent Labs Lab 05/17/16 2148 05/18/16 0609 05/19/16 0537 05/20/16 0551 05/21/16 0520  WBC 13.8* 11.8* 12.9* 11.6* 14.3*  NEUTROABS 9.8*  --   --   --   --   HGB 14.2 12.6 11.5* 12.3 13.0  HCT 43.5 38.3 35.0* 36.8 39.3  MCV 90.2 89.9 90.2 89.5 88.3  PLT 366 320 312 311 362    Basic Metabolic Panel:  Recent Labs Lab 05/18/16 0609 05/19/16 0537 05/20/16 0551 05/20/16 1024 05/20/16 1440 05/21/16 0520  NA 141 140 139  --  143 139  K 3.8 3.1* 3.1*  --  3.8 3.3*  CL 109 107 108  --  108 105  CO2 25 23 25   --  25 25  GLUCOSE 131* 116* 99  --  149* 121*  BUN 9 9 8   --  8 6  CREATININE 0.63 0.68 0.66  --  0.69 0.63  CALCIUM 8.2* 8.2* 8.0*  --  8.5* 8.3*  MG  --   --   --  1.8  --   --     Recent Results (from the past 240 hour(s))  Blood Culture (routine x 2)     Status: None (Preliminary result)   Collection Time: 05/17/16  9:18 PM  Result Value Ref Range Status   Specimen Description BLOOD RIGHT WRIST  Final   Special Requests BOTTLES DRAWN AEROBIC AND ANAEROBIC 5 CC  Final   Culture   Final    NO GROWTH 3 DAYS Performed [facility]    Report Status PENDING  Incomplete  Urine culture     Status: None   Collection Time: 05/17/16  9:32 PM  Result Value Ref Range Status   Specimen Description URINE, CATHETERIZED  Final   Special Requests NONE  Final   Culture NO GROWTH [facility]   Final   Report Status 05/19/2016 FINAL  Final  Blood Culture (routine x 2)     Status: None (Preliminary result)   Collection Time: 05/17/16  9:48 PM  Result Value Ref Range Status   Specimen Description BLOOD RIGHT ANTECUBITAL  Final   Special Requests BOTTLES DRAWN  AEROBIC AND ANAEROBIC 6 CC  Final   Culture   Final    NO GROWTH 3 DAYS [facility]     Report Status PENDING  Incomplete  MRSA PCR Screening     Status: None   Collection Time: 05/18/16  2:20 AM  Result Value Ref Range Status   MRSA by PCR NEGATIVE NEGATIVE Final  Comment:        The GeneXpert MRSA Assay (FDA approved for NASAL specimens only), is one component of a comprehensive MRSA colonization surveillance program. It is not intended to diagnose MRSA infection nor to guide or monitor treatment for MRSA infections.   C difficile quick scan w PCR reflex     Status: Abnormal   Collection Time: 05/21/16 10:38 AM  Result Value Ref Range Status   C Diff antigen POSITIVE (A) NEGATIVE Final   C Diff toxin POSITIVE (A) NEGATIVE Final   C Diff interpretation Toxin producing C. difficile detected.  Final    Comment: CRITICAL RESULT CALLED TO, READ BACK BY AND VERIFIED WITH: KAUR,B @ 1130 ON [id number] BY POTEAT,S      Liver Function Tests:  Recent Labs Lab 05/17/16 2148  AST 17  ALT 15  ALKPHOS 113  BILITOT 0.6  PROT 7.6  ALBUMIN 3.4*   No results for input(s): LIPASE, AMYLASE in the last 168 hours. No results for input(s): AMMONIA in the last 168 hours.  Cardiac Enzymes: No results for input(s): CKTOTAL, CKMB, CKMBINDEX, TROPONINI in the last 168 hours. BNP (last 3 results) No results for input(s): BNP in the last 8760 hours.  ProBNP (last 3 results) No results for input(s): PROBNP in the last 8760 hours.    Studies: No results found.  Scheduled Meds: . bisacodyl  20 mg Rectal Once  . enoxaparin (LOVENOX) injection  30 mg Subcutaneous Q24H  . escitalopram  10 mg Oral Daily  . feeding supplement (ENSURE ENLIVE)  237 mL Oral BID WC  . multivitamin  15 mL Oral Daily  . vancomycin  125 mg Oral QID      Time spent: 25 min  [facility]   [facility] Pager [phone]. If 7PM-7AM, please contact night-coverage at  [email or web address], Office  [phone]  password [email or web address] 05/21/2016, 3:58 PM  LOS: 3 days

## 2016-05-21 NOTE — Progress Notes (Signed)
[facility] when medically stable.

## 2016-05-21 NOTE — Progress Notes (Signed)
Pharmacy Antibiotic Note  Paige AreolaDaisy Flores is a 75 y.o. female admitted on 05/17/2016 with sepsis.  Pharmacy has been consulted for Vancomycin and Zosyn dosing.  Plan: 1) Continue current Zosyn dosing for now 2) Still unclear source of infection and cultures have been negative x 48 hours now - per previous plan, noted to discontinue abx's at this time.   Weight: 95 lb 14.4 oz (43.5 kg)  Temp (24hrs), Avg:98.8 F (37.1 C), Min:98.7 F (37.1 C), Max:98.8 F (37.1 C)   Recent Labs Lab 05/17/16 2148 05/17/16 2149 05/18/16 0609 05/19/16 0537 05/20/16 0551 05/20/16 1440 05/21/16 0520  WBC 13.8*  --  11.8* 12.9* 11.6*  --  14.3*  CREATININE 0.72  --  0.63 0.68 0.66 0.69 0.63  LATICACIDVEN  --  1.32  --   --   --   --   --     Estimated Creatinine Clearance: 38 mL/min (by C-G formula based on SCr of 0.63 mg/dL).    No Known Allergies  Antimicrobials this admission: Vancomycin 05/17/2016 >> Zosyn 05/17/2016 >>   Dose adjustments this admission: -  Microbiology results 12/24 BCx: ngtd 12/24 UCx: NGF 12/25 MRSA PCR: neg  Thank you for allowing pharmacy to be a part of this patient's care.  Hessie KnowsJustin M Whitley Patchen, PharmD, BCPS Pager [phone] 05/21/2016 9:02 AM

## 2016-05-22 LAB — BASIC METABOLIC PANEL WITH GFR
Anion gap: 8 (ref 5–15)
BUN: 7 mg/dL (ref 6–20)
CO2: 27 mmol/L (ref 22–32)
Calcium: 8.8 mg/dL — ABNORMAL LOW (ref 8.9–10.3)
Chloride: 106 mmol/L (ref 101–111)
Creatinine, Ser: 0.75 mg/dL (ref 0.44–1.00)
GFR calc Af Amer: >60 mL/min
GFR calc non Af Amer: >60 mL/min
Glucose, Bld: 88 mg/dL (ref 65–99)
Potassium: 3.8 mmol/L (ref 3.5–5.1)
Sodium: 141 mmol/L (ref 135–145)

## 2016-05-22 LAB — CBC
HCT: 39.5 % (ref 36.0–46.0)
Hemoglobin: 13.1 g/dL (ref 12.0–15.0)
MCH: 29.4 pg (ref 26.0–34.0)
MCHC: 33.2 g/dL (ref 30.0–36.0)
MCV: 88.8 fL (ref 78.0–100.0)
Platelets: 342 K/uL (ref 150–400)
RBC: 4.45 MIL/uL (ref 3.87–5.11)
RDW: 13.9 % (ref 11.5–15.5)
WBC: 15.2 K/uL — ABNORMAL HIGH (ref 4.0–10.5)

## 2016-05-22 MED ORDER — VANCOMYCIN 50 MG/ML ORAL SOLUTION: 125.0000 mg | Freq: Four times a day (QID) | ORAL | Status: AC

## 2016-05-22 MED ORDER — DILTIAZEM HCL ER COATED BEADS 120 MG PO CP24: 120.0000 mg | ORAL_CAPSULE | Freq: Every day | ORAL | Status: DC

## 2016-05-22 MED ORDER — ASPIRIN EC 81 MG PO TBEC: 81.0000 mg | DELAYED_RELEASE_TABLET | Freq: Every day | ORAL | Status: AC

## 2016-05-22 MED ORDER — DILTIAZEM HCL ER COATED BEADS 120 MG PO CP24: 120.0000 mg | ORAL_CAPSULE | Freq: Every day | ORAL | Status: AC

## 2016-05-22 NOTE — Clinical Social Work Placement (Addendum)
   CLINICAL SOCIAL WORK PLACEMENT  NOTE  Date:  05/22/2016  Patient Details  Name: Paige AreolaDaisy Flores MRN: [id number] Date of Birth: [birth year]  Clinical Social Work is seeking post-discharge placement for this patient at the Skilled  Nursing Facility level of care (*CSW will initial, date and re-position this form in  chart as items are completed):  Yes   Patient/family provided with [facility] Department's list of facilities offering this level of care within the geographic area requested by the patient (or if unable, by the patient's family).  Yes   Patient/family informed of their freedom to choose among providers that offer the needed level of care, that participate in Medicare, Medicaid or managed care program needed by the patient, have an available bed and are willing to accept the patient.  Yes   Patient/family informed of [facility]'s ownership interest in [facility] and [facility], as well as of the fact that they are under no obligation to receive care at these facilities.  PASRR submitted to EDS on       PASRR number received on       Existing PASRR number confirmed on 05/22/16     FL2 transmitted to all facilities in geographic area requested by pt/family on       FL2 transmitted to all facilities within larger geographic area on 05/20/16     Patient informed that his/her managed care company has contracts with or will negotiate with certain facilities, including the following:  [facility]     Yes   Patient/family informed of bed offers received.  [facility]     Physician recommends and patient [facility]    [facility] on 05[facility]22/[facility].  Patient to be transferred to facility by [facility]     Patient family notified on 05/22/16 of transfer.  Name of family member notified:  Daughter:Donna     PHYSICIAN Please sign DNR, Please prepare priority  discharge summary, including medications , Please sign FL2  Additional Comment:    _______________________________________________ Clearance CootsNicole A Sabrina Keough, LCSW 05/22/2016, 9:54 AM

## 2016-05-22 NOTE — Progress Notes (Signed)
Patients daughter notified of pt transfer to facility.

## 2016-05-22 NOTE — Progress Notes (Signed)
[facility] arrived to transfer patient. IV removed. Report [facility].

## 2016-05-22 NOTE — Discharge Summary (Addendum)
Physician Discharge Summary  Paige AreolaDaisy Flores ZOX:[id number] DOB: [birth year] DOA: 05/17/2016  PCP: Ron ParkerBOWEN,SAMUEL, MD  Admit date: 05/17/2016 Discharge date: 05/22/2016  Time spent: 25* minutes  Recommendations for Outpatient Follow-up:  1. Follow up Orthopedics Arlys JohnBrian Switeck in 4 weeks 2. HOLD CARDIZEM TILL DIARRHEA RESOLVES 3. Continue Aspirin 81 mg po daily for 6 weeks, stop after 07/10/15 4. She will be nonweightbearing, bed to chair transfers. After 6 weeks can start weight bearing as tolerated, bed to chair transfers 5. Continue vancomycin 125 mg po QID for 14 days. Stop on 06/03/16 6. Dose of Cardizem changed to 120 mg po daily   Discharge Diagnoses:  Principal Problem:   Fracture of femoral neck, right (HCC) Active Problems:   Paroxysmal atrial tachycardia (HCC)   SSS (sick sinus syndrome) (HCC)   Pacemaker   SIRS (systemic inflammatory response syndrome) (HCC)   Protein-calorie malnutrition, severe (HCC)   Hypokalemia   Contusion of face   Fall   Enteritis due to Clostridium difficile   Discharge Condition: Stable  Diet recommendation: Regular diet  Filed Weights   05/17/16 2112 05/18/16 0658  Weight: 50.7 kg (111 lb 12.4 oz) 43.5 kg (95 lb 14.4 oz)    History of present illness:  75 y.o.femalewith advanced dementia, sick sinus syndrome status post pacemaker placement, paroxysmal atrial tachycardia was brought to the ER, afterpatient had an unwitnessed fall at her nursing facility. Patient is noncommunicative at baseline and bed and wheelchair bound. She has lost 35-lbs since September of this year per our records and is frail appearing. She was found to be febrile and tachycardic in the ER. CXR and UA were unremarkable. Flu PCR negative. Cultures are pending and she is on empiric antibiotics. Pelvic XR demonstrates right femoral neck fracture. Due to patient's frailty, family and orthopedic surgery agreed to no surgery. Awaiting culture data, but if negative,  could likely stop antibiotics and discharge back to SNF on Wednesday.   Hospital Course:  1. .Unwitnessed fall with right hip fracture- orthopedic surgery consulted, both orthopedics as well as family opted for nonoperative approach.She will be nonweightbearing, bed to chair transfers. After 6 weeks can start weight bearing as tolerated, bed to chair transfers 2. Diarrhea /? C. difficile colitis- patient started on vancomycin 125 mg 4 times a day for C. difficile infection. Stop on 06/03/16 3. 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 Patient on dysphagia 1 diet with thin liquids as per speech therapy evaluation and recommendations. 4. Paroxysmal atrial fibrillation/sick sinus syndrome- status post pacemaker insertion. Continue Cardizem 120 mg po daily.HOLD TILL DIARRHEA RESOLVES. 5. Hypokalemia-replaced potassium  6. Moderate aortic valve stenosis- shows evidence of LVH and diastolic dysfunction but preserved EF on echocardiogram   Procedures:  None   Consultations:  Orthopedics  Discharge Exam: Vitals:   05/21/16 2317 05/22/16 0635  BP: 132/89 (!) 128/53  Pulse: 98 96  Resp: 18 17  Temp: 97.8 F (36.6 C) 97.9 F (36.6 C)    General: Appears in no acute distress Cardiovascular: RRR, S1S2 Respiratory: Clear bilaterally  Discharge Instructions   Discharge Instructions    Diet - low sodium heart healthy    Complete by:  As directed    Discharge instructions    Complete by:  As directed    She will be nonweightbearing, bed to chair transfers.  After 6 weeks, can start weight bearing as tolerated   Increase activity slowly    Complete by:  As directed      Current Discharge Medication  List    START taking these medications   Details  aspirin EC 81 MG tablet Take 1 tablet (81 mg total) by mouth daily.    diltiazem (CARDIZEM CD) 120 MG 24 hr capsule Take 1 capsule (120 mg total) by mouth daily.    vancomycin (VANCOCIN) 50 mg/mL oral solution  Take 2.5 mLs (125 mg total) by mouth 4 (four) times daily.      CONTINUE these medications which have NOT CHANGED   Details  acetaminophen (TYLENOL) 500 MG tablet Take 500 mg by mouth every 4 (four) hours as needed for mild pain.    alum & mag hydroxide-simeth (MAALOX PLUS) 400-400-40 MG/5ML suspension Take 15 mLs by mouth every 6 (six) hours as needed for indigestion.    cholecalciferol (VITAMIN D) 1000 units tablet Take 2,000 Units by mouth daily.    escitalopram (LEXAPRO) 10 MG tablet Take 10 mg by mouth daily.    guaifenesin (ROBITUSSIN) 100 MG/5ML syrup Take 200 mg by mouth every 6 (six) hours as needed for cough.    magnesium hydroxide (MILK OF MAGNESIA) 400 MG/5ML suspension Take 30 mLs by mouth at bedtime as needed for mild constipation.    Melatonin 5 MG TABS Take 10 mg by mouth daily.     neomycin-bacitracin-polymyxin (NEOSPORIN) ointment Apply 1 application topically as needed for wound care. apply to eye    Nutritional Supplements (NUTRITIONAL DRINK PO) Take 1 each by mouth 4 (four) times daily. Mighty shakes    FLUZONE HIGH-DOSE 0.5 ML SUSY       STOP taking these medications     diltiazem (DILACOR XR) 180 MG 24 hr capsule      fluconazole (DIFLUCAN) 100 MG tablet      loperamide (IMODIUM A-D) 2 MG tablet      magnesium oxide (MAG-OX) 400 MG tablet        No Known Allergies Contact information for after-discharge care    Destination    [facility] .   Specialty:  Skilled Nursing Facility Contact information: [address] [place] [id number] [phone]               The results of significant diagnostics from this hospitalization (including imaging, microbiology, ancillary and laboratory) are listed below for reference.    Significant Diagnostic Studies: Ct Head Wo Contrast  Result Date: 05/17/2016 CLINICAL DATA:  Vascular dementia. Unwitnessed fall. Bruised along the forehead. EXAM: CT HEAD WITHOUT  CONTRAST CT CERVICAL SPINE WITHOUT CONTRAST TECHNIQUE: Multidetector CT imaging of the head and cervical spine was performed following the standard protocol without intravenous contrast. Multiplanar CT image reconstructions of the cervical spine were also generated. COMPARISON:  04/25/2016 FINDINGS: Despite efforts by the technologist and patient, motion artifact is present on today's exam and could not be eliminated. This reduces exam sensitivity and specificity. CT HEAD FINDINGS Brain: The brainstem, cerebellum, cerebral peduncles, thalami, basal ganglia, basilar cisterns, and ventricular system appear within normal limits. Periventricular white matter and corona radiata hypodensities favor chronic ischemic microvascular white matter disease. No intracranial hemorrhage, mass lesion, or acute CVA. Cerebral atrophy. Vascular: Unremarkable Skull: Unremarkable Sinuses/Orbits: Unremarkable Other: No supplemental non-categorized findings. CT CERVICAL SPINE FINDINGS Alignment: 2.5 mm degenerative anterolisthesis at C4-5, no change from 04/25/2016. Skull base and vertebrae: No fracture or acute subluxation is identified. Extensive multilevel degenerative facet arthropathy. Loss of disc height at all levels between C5 and T2. Soft tissues and spinal canal: Unremarkable Disc levels: Osseous foraminal stenosis on the left that C3-4,  C4-5, and C5-6 due to facet and uncinate spurring. Upper chest: Aortic arch atherosclerotic calcification. Other: No supplemental non-categorized findings. IMPRESSION: 1. No acute intracranial findings or acute cervical spine findings. 2. Periventricular white matter and corona radiata hypodensities favor chronic ischemic microvascular white matter disease. 3. Considerable cervical spondylosis and degenerative disc disease. 4. Aortic arch atherosclerosis. Electronically Signed   By: Gaylyn Rong M.D.   On: 05/17/2016 21:47   Ct Head Wo Contrast  Result Date: 04/25/2016 CLINICAL DATA:   Status post fall, with concern for head or cervical spine injury. Initial encounter. EXAM: CT HEAD WITHOUT CONTRAST CT CERVICAL SPINE WITHOUT CONTRAST TECHNIQUE: Multidetector CT imaging of the head and cervical spine was performed following the standard protocol without intravenous contrast. Multiplanar CT image reconstructions of the cervical spine were also generated. COMPARISON:  CT of the head performed 03/16/2016, and CT of the cervical spine performed 03/14/2016 FINDINGS: CT HEAD FINDINGS Brain: No evidence of acute infarction, hemorrhage, hydrocephalus, extra-axial collection or mass lesion/mass effect. Prominence of the ventricles and sulci reflects moderately severe cortical volume loss. Mild cerebellar atrophy is noted. Scattered periventricular and subcortical white matter change likely reflects small vessel ischemic microangiopathy. Chronic ischemic change is noted at the external capsule bilaterally. The brainstem and fourth ventricle are within normal limits. The cerebral hemispheres demonstrate grossly normal gray-white differentiation. No mass effect or midline shift is seen. Vascular: No hyperdense vessel or unexpected calcification. Skull: There is no evidence of fracture; visualized osseous structures are unremarkable in appearance. Sinuses/Orbits: The orbits are within normal limits. The paranasal sinuses and mastoid air cells are well-aerated. Other: No significant soft tissue abnormalities are seen. CT CERVICAL SPINE FINDINGS Alignment: There is grade 1 anterolisthesis of C4 on C5, reflecting underlying facet disease. Skull base and vertebrae: No acute fracture. No primary bone lesion or focal pathologic process. Soft tissues and spinal canal: No prevertebral fluid or swelling. No visible canal hematoma. Disc levels: Multilevel disc space narrowing is noted along the lower cervical and upper thoracic spine, with scattered anterior and posterior disc osteophyte complexes. Upper chest: The  visualized portions of the thyroid gland are unremarkable. The visualized lung apices are grossly clear. Other: No additional soft tissue abnormalities are seen. IMPRESSION: 1. No evidence of traumatic intracranial injury or fracture. 2. No evidence of fracture or subluxation along the cervical spine. 3. Moderately severe cortical volume loss and scattered small vessel ischemic microangiopathy. 4. Chronic ischemic change at the external capsule bilaterally. 5. Mild diffuse degenerative change along the cervical and thoracic spine. Electronically Signed   By: Roanna Raider M.D.   On: 04/25/2016 01:38   Ct Cervical Spine Wo Contrast  Result Date: 05/17/2016 CLINICAL DATA:  Vascular dementia. Unwitnessed fall. Bruised along the forehead. EXAM: CT HEAD WITHOUT CONTRAST CT CERVICAL SPINE WITHOUT CONTRAST TECHNIQUE: Multidetector CT imaging of the head and cervical spine was performed following the standard protocol without intravenous contrast. Multiplanar CT image reconstructions of the cervical spine were also generated. COMPARISON:  04/25/2016 FINDINGS: Despite efforts by the technologist and patient, motion artifact is present on today's exam and could not be eliminated. This reduces exam sensitivity and specificity. CT HEAD FINDINGS Brain: The brainstem, cerebellum, cerebral peduncles, thalami, basal ganglia, basilar cisterns, and ventricular system appear within normal limits. Periventricular white matter and corona radiata hypodensities favor chronic ischemic microvascular white matter disease. No intracranial hemorrhage, mass lesion, or acute CVA. Cerebral atrophy. Vascular: Unremarkable Skull: Unremarkable Sinuses/Orbits: Unremarkable Other: No supplemental non-categorized findings. CT CERVICAL SPINE FINDINGS Alignment:  2.5 mm degenerative anterolisthesis at C4-5, no change from 04/25/2016. Skull base and vertebrae: No fracture or acute subluxation is identified. Extensive multilevel degenerative facet  arthropathy. Loss of disc height at all levels between C5 and T2. Soft tissues and spinal canal: Unremarkable Disc levels: Osseous foraminal stenosis on the left that C3-4, C4-5, and C5-6 due to facet and uncinate spurring. Upper chest: Aortic arch atherosclerotic calcification. Other: No supplemental non-categorized findings. IMPRESSION: 1. No acute intracranial findings or acute cervical spine findings. 2. Periventricular white matter and corona radiata hypodensities favor chronic ischemic microvascular white matter disease. 3. Considerable cervical spondylosis and degenerative disc disease. 4. Aortic arch atherosclerosis. Electronically Signed   By: Gaylyn Rong M.D.   On: 05/17/2016 21:47   Ct Cervical Spine Wo Contrast  Result Date: 04/25/2016 CLINICAL DATA:  Status post fall, with concern for head or cervical spine injury. Initial encounter. EXAM: CT HEAD WITHOUT CONTRAST CT CERVICAL SPINE WITHOUT CONTRAST TECHNIQUE: Multidetector CT imaging of the head and cervical spine was performed following the standard protocol without intravenous contrast. Multiplanar CT image reconstructions of the cervical spine were also generated. COMPARISON:  CT of the head performed 03/16/2016, and CT of the cervical spine performed 03/14/2016 FINDINGS: CT HEAD FINDINGS Brain: No evidence of acute infarction, hemorrhage, hydrocephalus, extra-axial collection or mass lesion/mass effect. Prominence of the ventricles and sulci reflects moderately severe cortical volume loss. Mild cerebellar atrophy is noted. Scattered periventricular and subcortical white matter change likely reflects small vessel ischemic microangiopathy. Chronic ischemic change is noted at the external capsule bilaterally. The brainstem and fourth ventricle are within normal limits. The cerebral hemispheres demonstrate grossly normal gray-white differentiation. No mass effect or midline shift is seen. Vascular: No hyperdense vessel or unexpected  calcification. Skull: There is no evidence of fracture; visualized osseous structures are unremarkable in appearance. Sinuses/Orbits: The orbits are within normal limits. The paranasal sinuses and mastoid air cells are well-aerated. Other: No significant soft tissue abnormalities are seen. CT CERVICAL SPINE FINDINGS Alignment: There is grade 1 anterolisthesis of C4 on C5, reflecting underlying facet disease. Skull base and vertebrae: No acute fracture. No primary bone lesion or focal pathologic process. Soft tissues and spinal canal: No prevertebral fluid or swelling. No visible canal hematoma. Disc levels: Multilevel disc space narrowing is noted along the lower cervical and upper thoracic spine, with scattered anterior and posterior disc osteophyte complexes. Upper chest: The visualized portions of the thyroid gland are unremarkable. The visualized lung apices are grossly clear. Other: No additional soft tissue abnormalities are seen. IMPRESSION: 1. No evidence of traumatic intracranial injury or fracture. 2. No evidence of fracture or subluxation along the cervical spine. 3. Moderately severe cortical volume loss and scattered small vessel ischemic microangiopathy. 4. Chronic ischemic change at the external capsule bilaterally. 5. Mild diffuse degenerative change along the cervical and thoracic spine. Electronically Signed   By: Roanna Raider M.D.   On: 04/25/2016 01:38   Dg Pelvis Portable  Result Date: 05/18/2016 CLINICAL DATA:  Unwitnessed fall.  Dementia. EXAM: PORTABLE PELVIS 1-2 VIEWS COMPARISON:  None. FINDINGS: Single view of the pelvis. Large amount of ascending colonic and cecal stool. Femoral heads are located. Sacroiliac joints are grossly symmetric. Apparent deformity of the right femoral head/ neck junction. IMPRESSION: Apparent deformity of the right femoral head/neck junction. Suspicious for fracture. correlate with patient's area of tenderness and consider dedicated right hip radiographs.  Electronically Signed   By: Jeronimo Greaves M.D.   On: 05/18/2016 08:59   Dg  Chest Port 1 View  Result Date: 05/17/2016 CLINICAL DATA:  Altered mental status, unwitnessed fall. Dementia. History of hypertension, diabetes, bradycardia and former smoker. EXAM: PORTABLE CHEST 1 VIEW COMPARISON:  03/16/2016 FINDINGS: The heart is top-normal in size. The aorta is tortuous and atherosclerotic. Right atrial and right ventricular pacing wires are noted with left-sided pacemaker apparatus projecting over the left axilla. Lungs are free of pneumonic consolidations, CHF and effusions. No pneumothoraces. No acute osseous abnormality. The patient's chin obscures the medial lung apices. IMPRESSION: No active disease. Electronically Signed   By: Tollie Eth M.D.   On: 05/17/2016 21:45   Dg Abd Portable 1v  Result Date: 05/19/2016 CLINICAL DATA:  Constipation EXAM: PORTABLE ABDOMEN - 1 VIEW COMPARISON:  05/18/2016 FINDINGS: Moderate stool in the right colon unchanged. No significant stool in the left colon or rectum. Nonobstructive bowel gas pattern. Deformity of the right subcapital femoral neck suggestive of acute or chronic fracture similar to the prior study. Correlate with symptoms. IMPRESSION: No change in stool in the right colon. Nonobstructive bowel gas pattern. Right femoral neck deformity suggestive of acute or chronic fracture. Electronically Signed   By: Marlan Palau M.D.   On: 05/19/2016 06:52    Microbiology: Recent Results (from the past 240 hour(s))  Blood Culture (routine x 2)     Status: None (Preliminary result)   Collection Time: 05/17/16  9:18 PM  Result Value Ref Range Status   Specimen Description BLOOD RIGHT WRIST  Final   Special Requests BOTTLES DRAWN AEROBIC AND ANAEROBIC 5 CC  Final   Culture   Final    NO GROWTH 3 DAYS Performed at [facility]    Report Status PENDING  Incomplete  Urine culture     Status: None   Collection Time: 05/17/16  9:32 PM  Result Value Ref  Range Status   Specimen Description URINE, CATHETERIZED  Final   Special Requests NONE  Final   Culture NO GROWTH Performed at [facility]   Final   Report Status 05/19/2016 FINAL  Final  Blood Culture (routine x 2)     Status: None (Preliminary result)   Collection Time: 05/17/16  9:48 PM  Result Value Ref Range Status   Specimen Description BLOOD RIGHT ANTECUBITAL  Final   Special Requests BOTTLES DRAWN AEROBIC AND ANAEROBIC 6 CC  Final   Culture   Final    NO GROWTH 3 DAYS Performed at [facility]    Report Status PENDING  Incomplete  MRSA PCR Screening     Status: None   Collection Time: 05/18/16  2:20 AM  Result Value Ref Range Status   MRSA by PCR NEGATIVE NEGATIVE Final    Comment:        The GeneXpert MRSA Assay (FDA approved for NASAL specimens only), is one component of a comprehensive MRSA colonization surveillance program. It is not intended to diagnose MRSA infection nor to guide or monitor treatment for MRSA infections.   C difficile quick scan w PCR reflex     Status: Abnormal   Collection Time: 05/21/16 10:38 AM  Result Value Ref Range Status   C Diff antigen POSITIVE (A) NEGATIVE Final   C Diff toxin POSITIVE (A) NEGATIVE Final   C Diff interpretation Toxin producing C. difficile detected.  Final    Comment: CRITICAL RESULT CALLED TO, READ BACK BY AND VERIFIED WITH: KAUR,B @ 1130 ON [id number] BY POTEAT,S      Labs: Basic Metabolic Panel:  Recent  Labs Lab 05/19/16 0537 05/20/16 0551 05/20/16 1024 05/20/16 1440 05/21/16 0520 05/22/16 0541  NA 140 139  --  143 139 141  K 3.1* 3.1*  --  3.8 3.3* 3.8  CL 107 108  --  108 105 106  CO2 23 25  --  25 25 27   GLUCOSE 116* 99  --  149* 121* 88  BUN 9 8  --  8 6 7   CREATININE 0.68 0.66  --  0.69 0.63 0.75  CALCIUM 8.2* 8.0*  --  8.5* 8.3* 8.8*  MG  --   --  1.8  --   --   --    Liver Function Tests:  Recent Labs Lab 05/17/16 2148  AST 17  ALT 15  ALKPHOS 113  BILITOT 0.6  PROT  7.6  ALBUMIN 3.4*   No results for input(s): LIPASE, AMYLASE in the last 168 hours. No results for input(s): AMMONIA in the last 168 hours. CBC:  Recent Labs Lab 05/17/16 2148 05/18/16 0609 05/19/16 0537 05/20/16 0551 05/21/16 0520 05/22/16 0541  WBC 13.8* 11.8* 12.9* 11.6* 14.3* 15.2*  NEUTROABS 9.8*  --   --   --   --   --   HGB 14.2 12.6 11.5* 12.3 13.0 13.1  HCT 43.5 38.3 35.0* 36.8 39.3 39.5  MCV 90.2 89.9 90.2 89.5 88.3 88.8  PLT 366 320 312 311 362 342       Signed:  Mechelle Pates S MD.  [facility] 05/22/2016, 11:13 AM

## 2016-05-22 NOTE — Discharge Instructions (Signed)
She will be nonweightbearing, bed to chair transfers

## 2016-05-23 LAB — CULTURE, BLOOD (ROUTINE X 2)
CULTURE: NO GROWTH
CULTURE: NO GROWTH

## 2017-12-03 ENCOUNTER — Encounter: Payer: Self-pay | Admitting: Cardiology

## 2018-02-08 IMAGING — CT CT CERVICAL SPINE W/O CM
4 of 7 series · 14 of 33 positions shown, 15 images · non-contrast
Comparison: CT HEAD and cervical spine February 29, 2016

CLINICAL DATA: Found down beside bed on floor mat. No apparent
injury. History of dementia, hypertension and diabetes.

EXAM:
CT HEAD WITHOUT CONTRAST
CT CERVICAL SPINE WITHOUT CONTRAST
TECHNIQUE: Multidetector CT imaging of the head and cervical spine was
performed following the standard protocol without intravenous
contrast. Multiplanar CT image reconstructions of the cervical spine
were also generated.

[Series 4: bone windows · axial · 0.43mm/px · z∈[-97,-25]mm · 3 of 49 slices shown, 4 images]
[im 13/49  soft-tissue]
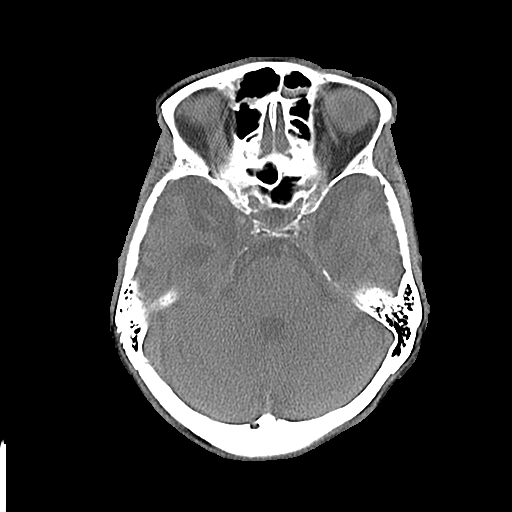
[im 13/49  bone]
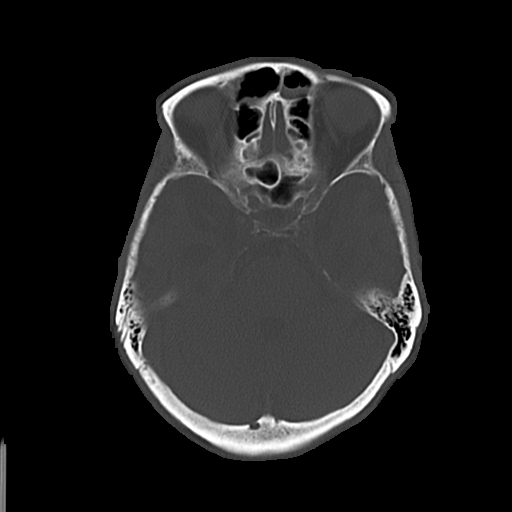
[im 25/49  bone]
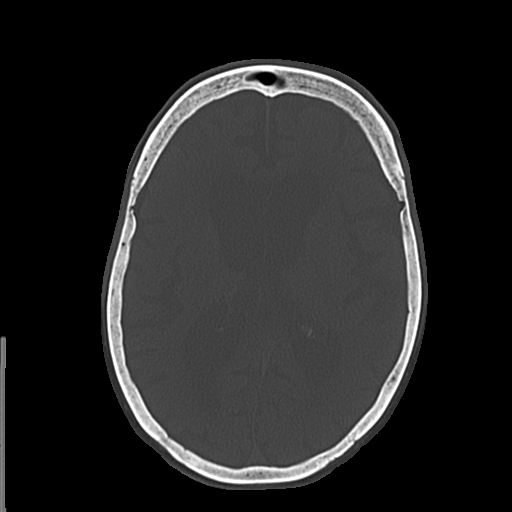
[im 37/49  bone]
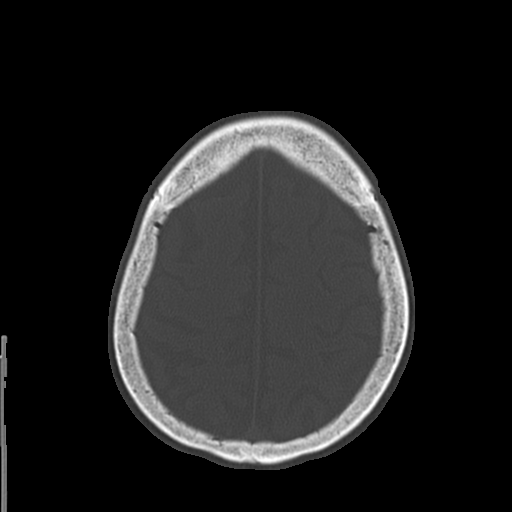

[Series 6: c-spine st · axial · 0.29mm/px · z∈[-236,-160]mm · 4 of 64 slices shown]
[im 13/64  bone]
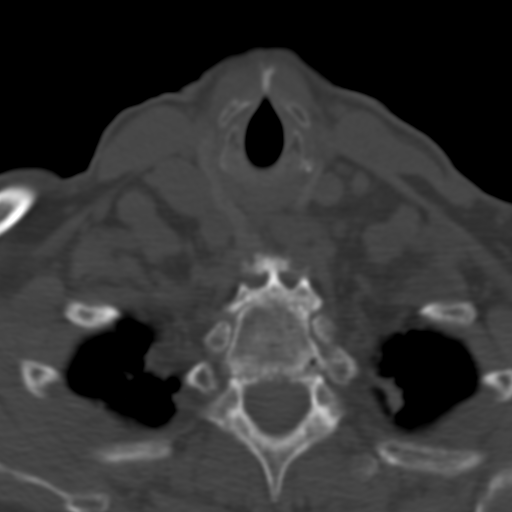
[im 26/64  bone]
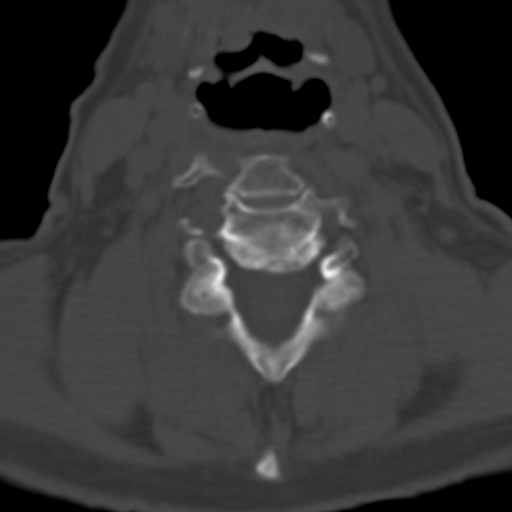
[im 38/64  bone]
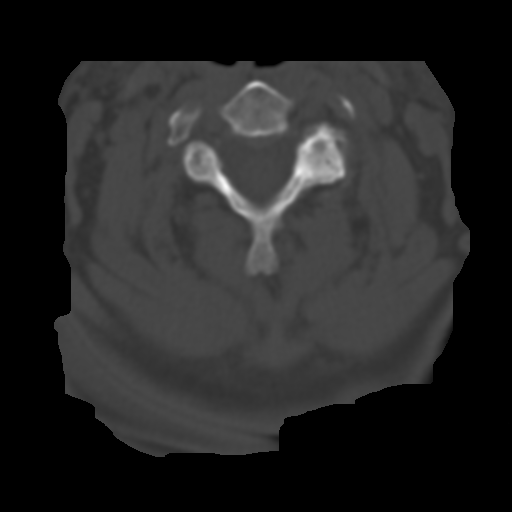
[im 51/64  bone]
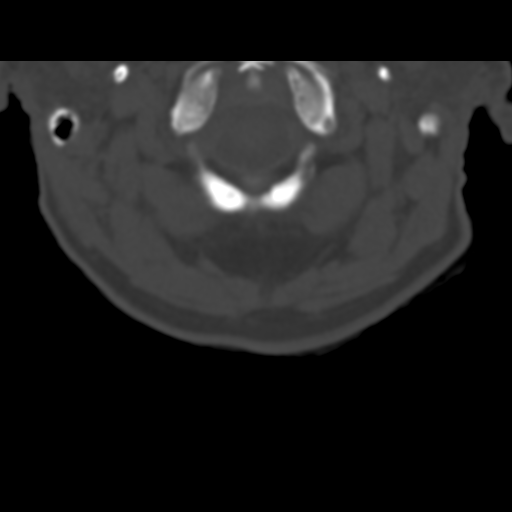

[Series 10: sagittal · sagittal · 0.31mm/px · 5 of 49 slices shown]
[im 9/49  bone]
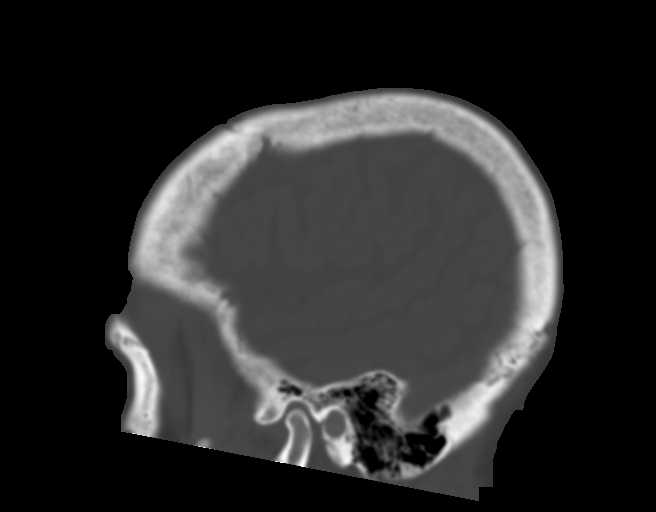
[im 17/49  bone]
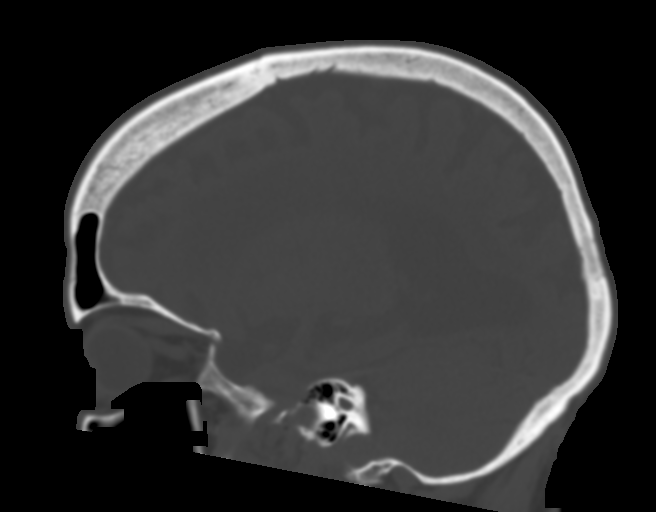
[im 25/49  bone]
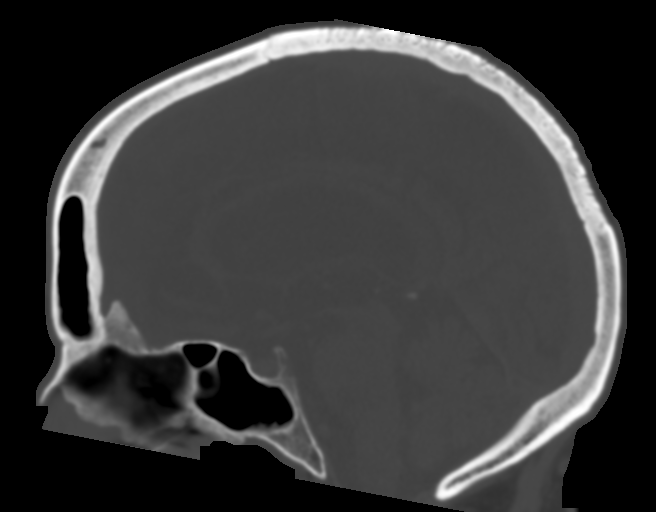
[im 33/49  bone]
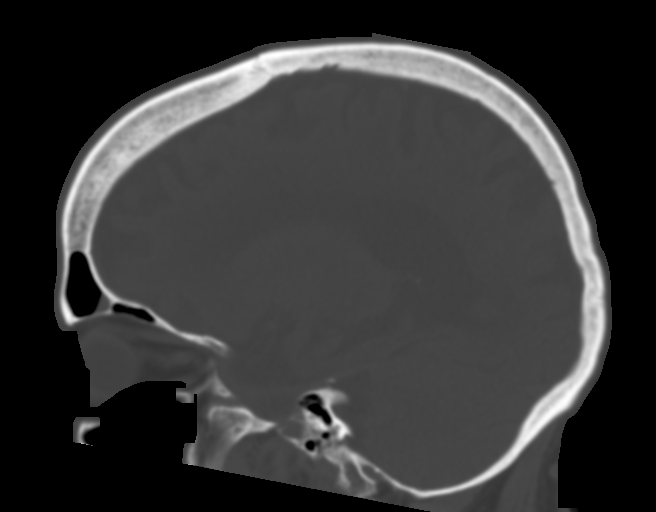
[im 41/49  bone]
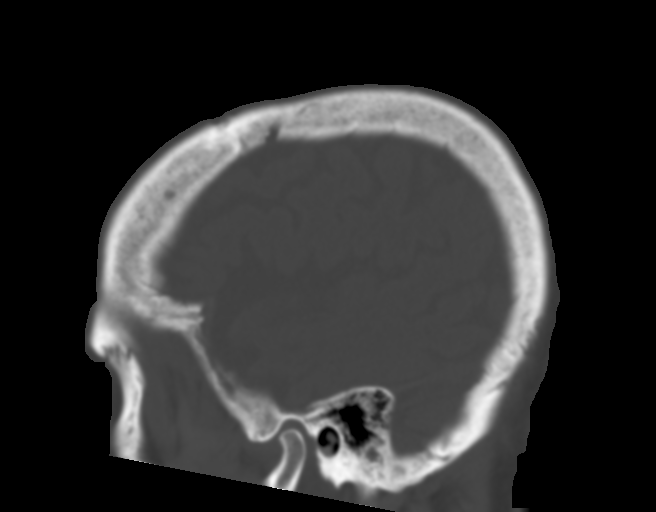

[Series 11: axial recon · coronal · 0.23mm/px · 2 of 94 slices shown]
[im 32/94  bone]
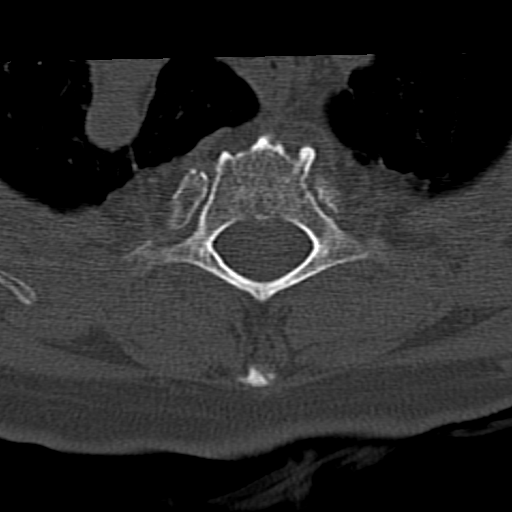
[im 63/94  bone]
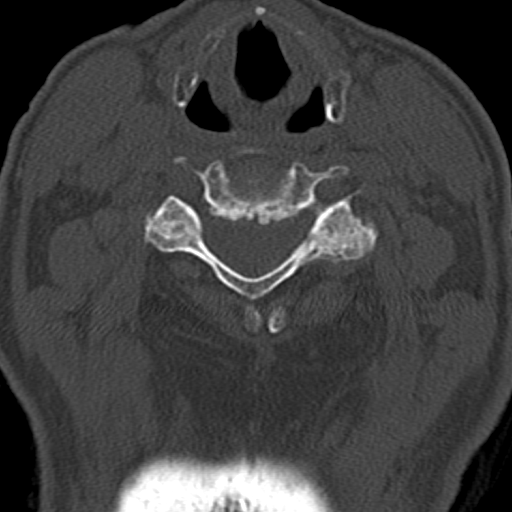

[14 of 33 positions shown; findings below may reference images not displayed]

FINDINGS: CT HEAD FINDINGS

BRAIN: Severe ventriculomegaly on the basis of global parenchymal
brain volume loss No intraparenchymal hemorrhage, mass effect nor
midline shift. Patchy supratentorial white matter hypodensities
within normal range for patient's age, though non-specific are most
compatible with chronic small vessel ischemic disease. No acute
large vascular territory infarcts. No abnormal extra-axial fluid
collections. Basal cisterns are patent.

VASCULAR: Mild calcific atherosclerosis of the carotid siphons.

SKULL: No skull fracture. Small residual bifrontal scalp hematoma.
No subcutaneous gas or radiopaque foreign bodies.

SINUSES/ORBITS: Mild paranasal sinus mucosal thickening and
scattered air-fluid levels. Small LEFT mastoid effusion. Soft tissue
within the bilateral external auditory canals most compatible with
cerumen. The included ocular globes and orbital contents are
non-suspicious.

OTHER: None.

CT CERVICAL SPINE FINDINGS

ALIGNMENT: Maintained lordosis. Stable grade 1 C4-5 anterolisthesis.

SKULL BASE AND VERTEBRAE: Cervical vertebral bodies and posterior
elements are intact. Severe C5-6 through T1-2 disc height loss,
endplate spurring compatible with degenerative discs. Moderate C3-4
degenerative disc. Moderate to severe upper cervical facet
arthropathy. C1-2 articulation maintained with mild arthropathy.
Osteopenia. No destructive bony lesions.

SOFT TISSUES AND SPINAL CANAL: Normal.

DISC LEVELS: No significant osseous canal stenosis. Moderate to
severe C3-4, moderate C4-5, moderate to severe C5-6 neural foraminal
narrowing.

UPPER CHEST: Lung apices are clear.

OTHER: None.
IMPRESSION: CT HEAD: No acute intracranial process. Small residual frontal scalp
hematoma. No skull fracture.

Stable chronic changes including moderate to severe global brain
atrophy and moderate to severe chronic small vessel ischemic
disease.

CT CERVICAL SPINE: No acute fracture. Stable grade 1 C4-5
anterolisthesis on degenerative basis.

## 2018-06-25 DEATH — deceased
